# Patient Record
Sex: Male | Born: 1970 | Race: White | Hispanic: No | Marital: Married | Smoking: Never smoker
Health system: Southern US, Community
[De-identification: ages and names within clinical notes are randomized; demographics above are authoritative.]

## PROBLEM LIST (undated history)

## (undated) DIAGNOSIS — T8859XA Other complications of anesthesia, initial encounter: Secondary | ICD-10-CM

## (undated) DIAGNOSIS — R51 Headache: Secondary | ICD-10-CM

## (undated) DIAGNOSIS — M542 Cervicalgia: Secondary | ICD-10-CM

## (undated) DIAGNOSIS — K5900 Constipation, unspecified: Secondary | ICD-10-CM

## (undated) DIAGNOSIS — Z9889 Other specified postprocedural states: Secondary | ICD-10-CM

## (undated) DIAGNOSIS — Z8719 Personal history of other diseases of the digestive system: Secondary | ICD-10-CM

## (undated) DIAGNOSIS — K649 Unspecified hemorrhoids: Secondary | ICD-10-CM

## (undated) DIAGNOSIS — K219 Gastro-esophageal reflux disease without esophagitis: Secondary | ICD-10-CM

## (undated) DIAGNOSIS — T4145XA Adverse effect of unspecified anesthetic, initial encounter: Secondary | ICD-10-CM

## (undated) DIAGNOSIS — R112 Nausea with vomiting, unspecified: Secondary | ICD-10-CM

## (undated) HISTORY — PX: VASECTOMY: SHX75

## (undated) HISTORY — DX: Cervicalgia: M54.2

---

## 2000-09-27 HISTORY — PX: KNEE ARTHROSCOPY W/ ACL RECONSTRUCTION: SHX1858

## 2004-08-25 ENCOUNTER — Ambulatory Visit: Payer: Self-pay | Admitting: Family Medicine

## 2005-11-02 ENCOUNTER — Observation Stay (HOSPITAL_COMMUNITY): Admission: EM | Admit: 2005-11-02 | Discharge: 2005-11-03 | Payer: Self-pay | Admitting: Emergency Medicine

## 2005-11-03 ENCOUNTER — Ambulatory Visit: Payer: Self-pay | Admitting: Emergency Medicine

## 2006-08-28 ENCOUNTER — Ambulatory Visit (HOSPITAL_COMMUNITY): Admission: RE | Admit: 2006-08-28 | Discharge: 2006-08-28 | Payer: Self-pay | Admitting: Family Medicine

## 2012-10-25 ENCOUNTER — Encounter (HOSPITAL_COMMUNITY): Payer: Self-pay | Admitting: *Deleted

## 2012-10-25 ENCOUNTER — Encounter (HOSPITAL_COMMUNITY): Payer: Self-pay | Admitting: Pharmacy Technician

## 2012-10-25 MED ORDER — CEFAZOLIN SODIUM-DEXTROSE 2-3 GM-% IV SOLR
2.0000 g | INTRAVENOUS | Status: AC
  Administered 2012-10-26: 2 g via INTRAVENOUS

## 2012-10-25 MED ORDER — LACTATED RINGERS IV SOLN: INTRAVENOUS | Status: DC

## 2012-10-25 MED ORDER — CHLORHEXIDINE GLUCONATE 4 % EX LIQD: 60.0000 mL | Freq: Once | CUTANEOUS | Status: DC

## 2012-10-26 ENCOUNTER — Encounter (HOSPITAL_COMMUNITY): Payer: Self-pay | Admitting: Anesthesiology

## 2012-10-26 ENCOUNTER — Encounter (HOSPITAL_COMMUNITY)
Admission: RE | Disposition: A | Discharge status: Home / Self Care | Payer: Self-pay | Source: Ambulatory Visit | Attending: Orthopedic Surgery

## 2012-10-26 ENCOUNTER — Ambulatory Visit (HOSPITAL_COMMUNITY): Payer: Managed Care, Other (non HMO) | Admitting: Anesthesiology

## 2012-10-26 ENCOUNTER — Ambulatory Visit (HOSPITAL_COMMUNITY)
Admission: RE | Admit: 2012-10-26 | Discharge: 2012-10-26 | Disposition: A | Discharge status: Home / Self Care | Payer: Managed Care, Other (non HMO) | Source: Ambulatory Visit | Attending: Orthopedic Surgery | Admitting: Orthopedic Surgery

## 2012-10-26 DIAGNOSIS — K219 Gastro-esophageal reflux disease without esophagitis: Secondary | ICD-10-CM | POA: Insufficient documentation

## 2012-10-26 DIAGNOSIS — W010XXA Fall on same level from slipping, tripping and stumbling without subsequent striking against object, initial encounter: Secondary | ICD-10-CM | POA: Insufficient documentation

## 2012-10-26 DIAGNOSIS — S86819A Strain of other muscle(s) and tendon(s) at lower leg level, unspecified leg, initial encounter: Secondary | ICD-10-CM | POA: Insufficient documentation

## 2012-10-26 DIAGNOSIS — S838X9A Sprain of other specified parts of unspecified knee, initial encounter: Secondary | ICD-10-CM | POA: Insufficient documentation

## 2012-10-26 HISTORY — DX: Nausea with vomiting, unspecified: R11.2

## 2012-10-26 HISTORY — DX: Headache: R51

## 2012-10-26 HISTORY — DX: Adverse effect of unspecified anesthetic, initial encounter: T41.45XA

## 2012-10-26 HISTORY — PX: QUADRICEPS TENDON REPAIR: SHX756

## 2012-10-26 HISTORY — DX: Personal history of other diseases of the digestive system: Z87.19

## 2012-10-26 HISTORY — DX: Constipation, unspecified: K59.00

## 2012-10-26 HISTORY — DX: Other complications of anesthesia, initial encounter: T88.59XA

## 2012-10-26 HISTORY — DX: Unspecified hemorrhoids: K64.9

## 2012-10-26 HISTORY — DX: Gastro-esophageal reflux disease without esophagitis: K21.9

## 2012-10-26 HISTORY — DX: Other specified postprocedural states: Z98.890

## 2012-10-26 LAB — BASIC METABOLIC PANEL
Chloride: 102 mEq/L (ref 96–112)
Creatinine, Ser: 0.87 mg/dL (ref 0.50–1.35)
GFR calc Af Amer: >90 mL/min (ref >90)
GFR calc non Af Amer: >90 mL/min (ref >90)
Potassium: 3.6 mEq/L (ref 3.5–5.1)

## 2012-10-26 LAB — CBC
MCHC: 34.8 g/dL (ref 30.0–36.0)
Platelets: 121 10*3/uL — ABNORMAL LOW (ref 150–400)
RDW: 12.8 % (ref 11.5–15.5)
WBC: 5.1 10*3/uL (ref 4.0–10.5)

## 2012-10-26 LAB — SURGICAL PCR SCREEN: MRSA, PCR: NEGATIVE

## 2012-10-26 SURGERY — REPAIR, TENDON, QUADRICEPS
Anesthesia: General | Site: Knee | Laterality: Left | Wound class: Clean

## 2012-10-26 MED ORDER — ONDANSETRON HCL 4 MG/2ML IJ SOLN: 4.0000 mg | Freq: Once | INTRAMUSCULAR | Status: DC | PRN

## 2012-10-26 MED ORDER — HYDROMORPHONE HCL PF 1 MG/ML IJ SOLN: 0.2500 mg | INTRAMUSCULAR | Status: DC | PRN

## 2012-10-26 MED ORDER — MUPIROCIN 2 % EX OINT
TOPICAL_OINTMENT | Freq: Two times a day (BID) | CUTANEOUS | Status: DC
  Filled 2012-10-26: qty 22

## 2012-10-26 MED ORDER — OXYCODONE-ACETAMINOPHEN 5-325 MG PO TABS: 1.0000 | ORAL_TABLET | ORAL | Status: DC | PRN

## 2012-10-26 MED ORDER — SODIUM CHLORIDE 0.9 % IR SOLN
Status: DC | PRN
  Administered 2012-10-26: 1000 mL

## 2012-10-26 MED ORDER — CYCLOBENZAPRINE HCL 10 MG PO TABS
10.0000 mg | ORAL_TABLET | Freq: Three times a day (TID) | ORAL | Status: DC | PRN
Start: 2012-10-26 — End: 2018-10-09

## 2012-10-26 MED ORDER — DEXAMETHASONE SODIUM PHOSPHATE 10 MG/ML IJ SOLN
INTRAMUSCULAR | Status: DC | PRN
  Administered 2012-10-26: 10 mg via INTRAVENOUS

## 2012-10-26 MED ORDER — FENTANYL CITRATE 0.05 MG/ML IJ SOLN
INTRAMUSCULAR | Status: DC | PRN
  Administered 2012-10-26: 50 ug via INTRAVENOUS
  Administered 2012-10-26 (×2): 75 ug via INTRAVENOUS

## 2012-10-26 MED ORDER — LIDOCAINE HCL (CARDIAC) 20 MG/ML IV SOLN
INTRAVENOUS | Status: DC | PRN
  Administered 2012-10-26: 100 mg via INTRAVENOUS

## 2012-10-26 MED ORDER — ROCURONIUM BROMIDE 100 MG/10ML IV SOLN
INTRAVENOUS | Status: DC | PRN
  Administered 2012-10-26: 30 mg via INTRAVENOUS

## 2012-10-26 MED ORDER — FENTANYL CITRATE 0.05 MG/ML IJ SOLN
INTRAMUSCULAR | Status: AC
  Administered 2012-10-26: 100 ug
  Filled 2012-10-26: qty 2

## 2012-10-26 MED ORDER — PROPOFOL 10 MG/ML IV BOLUS
INTRAVENOUS | Status: DC | PRN
  Administered 2012-10-26: 200 mg via INTRAVENOUS

## 2012-10-26 MED ORDER — LACTATED RINGERS IV SOLN
INTRAVENOUS | Status: DC
  Administered 2012-10-26: 10:00:00 via INTRAVENOUS

## 2012-10-26 MED ORDER — ONDANSETRON HCL 4 MG/2ML IJ SOLN
INTRAMUSCULAR | Status: DC | PRN
  Administered 2012-10-26 (×2): 4 mg via INTRAVENOUS

## 2012-10-26 MED ORDER — LACTATED RINGERS IV SOLN
INTRAVENOUS | Status: DC | PRN
  Administered 2012-10-26: 10:00:00 via INTRAVENOUS

## 2012-10-26 MED ORDER — MIDAZOLAM HCL 5 MG/5ML IJ SOLN
INTRAMUSCULAR | Status: DC | PRN
  Administered 2012-10-26: 2 mg via INTRAVENOUS

## 2012-10-26 MED ORDER — BUPIVACAINE HCL (PF) 0.5 % IJ SOLN
INTRAMUSCULAR | Status: DC | PRN
  Administered 2012-10-26: 20 mL

## 2012-10-26 SURGICAL SUPPLY — 55 items
BANDAGE ELASTIC 4 VELCRO ST LF (GAUZE/BANDAGES/DRESSINGS) ×2 IMPLANT
BANDAGE ELASTIC 6 VELCRO ST LF (GAUZE/BANDAGES/DRESSINGS) ×2 IMPLANT
BANDAGE GAUZE ELAST BULKY 4 IN (GAUZE/BANDAGES/DRESSINGS) ×2 IMPLANT
BNDG COHESIVE 4X5 TAN STRL (GAUZE/BANDAGES/DRESSINGS) ×2 IMPLANT
BNDG ESMARK 4X9 LF (GAUZE/BANDAGES/DRESSINGS) ×2 IMPLANT
CLOTH BEACON ORANGE TIMEOUT ST (SAFETY) ×2 IMPLANT
CUFF TOURNIQUET SINGLE 34IN LL (TOURNIQUET CUFF) ×2 IMPLANT
DRAPE ORTHO SPLIT 77X108 STRL (DRAPES) ×2
DRAPE SURG ORHT 6 SPLT 77X108 (DRAPES) ×2 IMPLANT
DRAPE U-SHAPE 47X51 STRL (DRAPES) IMPLANT
DRILL BIT 7/64X5 (BIT) ×2 IMPLANT
DRSG ADAPTIC 3X8 NADH LF (GAUZE/BANDAGES/DRESSINGS) IMPLANT
DRSG PAD ABDOMINAL 8X10 ST (GAUZE/BANDAGES/DRESSINGS) ×4 IMPLANT
GAUZE XEROFORM 1X8 LF (GAUZE/BANDAGES/DRESSINGS) ×2 IMPLANT
GAUZE XEROFORM 5X9 LF (GAUZE/BANDAGES/DRESSINGS) ×2 IMPLANT
GLOVE BIO SURGEON STRL SZ7.5 (GLOVE) ×2 IMPLANT
GLOVE BIO SURGEON STRL SZ8 (GLOVE) ×6 IMPLANT
GLOVE BIO SURGEON STRL SZ8.5 (GLOVE) ×2 IMPLANT
GLOVE EUDERMIC 7 POWDERFREE (GLOVE) ×2 IMPLANT
GLOVE SS BIOGEL STRL SZ 7.5 (GLOVE) ×2 IMPLANT
GLOVE SUPERSENSE BIOGEL SZ 7.5 (GLOVE) ×2
GOWN STRL NON-REIN LRG LVL3 (GOWN DISPOSABLE) ×2 IMPLANT
GOWN STRL REIN XL XLG (GOWN DISPOSABLE) ×4 IMPLANT
HANDPIECE INTERPULSE COAX TIP (DISPOSABLE)
IMMOBILIZER KNEE 22 UNIV (SOFTGOODS) ×2 IMPLANT
IMMOBILIZER KNEE 24 THIGH 36 (MISCELLANEOUS) ×1 IMPLANT
IMMOBILIZER KNEE 24 UNIV (MISCELLANEOUS) ×2
KIT BASIN OR (CUSTOM PROCEDURE TRAY) ×2 IMPLANT
KIT ROOM TURNOVER OR (KITS) ×2 IMPLANT
MANIFOLD NEPTUNE II (INSTRUMENTS) ×2 IMPLANT
NDL SUT 2 .5 CRC MAYO 1.732X (NEEDLE) ×1 IMPLANT
NEEDLE KEITH (NEEDLE) ×2 IMPLANT
NEEDLE MAYO TAPER (NEEDLE) ×1
NS IRRIG 1000ML POUR BTL (IV SOLUTION) ×2 IMPLANT
PACK GENERAL/GYN (CUSTOM PROCEDURE TRAY) ×2 IMPLANT
PAD ARMBOARD 7.5X6 YLW CONV (MISCELLANEOUS) ×4 IMPLANT
PAD CAST 4YDX4 CTTN HI CHSV (CAST SUPPLIES) ×2 IMPLANT
PADDING CAST COTTON 4X4 STRL (CAST SUPPLIES) ×2
PADDING CAST COTTON 6X4 STRL (CAST SUPPLIES) ×2 IMPLANT
PASSER SUT SWANSON 36MM LOOP (INSTRUMENTS) IMPLANT
SET HNDPC FAN SPRY TIP SCT (DISPOSABLE) IMPLANT
SPONGE GAUZE 4X4 12PLY (GAUZE/BANDAGES/DRESSINGS) ×2 IMPLANT
SPONGE LAP 4X18 X RAY DECT (DISPOSABLE) IMPLANT
STOCKINETTE IMPERVIOUS 9X36 MD (GAUZE/BANDAGES/DRESSINGS) ×2 IMPLANT
SUT ETHIBOND 2 V 37 (SUTURE) IMPLANT
SUT FIBERWIRE #2 38 T-5 BLUE (SUTURE) ×4
SUT MNCRL AB 3-0 PS2 18 (SUTURE) ×2 IMPLANT
SUT VIC AB 0 CT1 27 (SUTURE) ×1
SUT VIC AB 0 CT1 27XBRD ANBCTR (SUTURE) ×1 IMPLANT
SUT VIC AB 1 CT1 27 (SUTURE) ×1
SUT VIC AB 1 CT1 27XBRD ANBCTR (SUTURE) ×1 IMPLANT
SUT VIC AB 2-0 CT1 27 (SUTURE) ×1
SUT VIC AB 2-0 CT1 TAPERPNT 27 (SUTURE) ×1 IMPLANT
SUTURE FIBERWR #2 38 T-5 BLUE (SUTURE) ×2 IMPLANT
WATER STERILE IRR 1000ML POUR (IV SOLUTION) ×2 IMPLANT

## 2012-10-26 NOTE — H&P (Signed)
Shane Graham    Chief Complaint: Left Quad Tendon Rupture HPI: The patient is a 42 y.o. male with left quad tendon rupture  Past Medical History  Diagnosis Date  . Complication of anesthesia   . PONV (postoperative nausea and vomiting)   . Headache     not recently  . Hemorrhoid   . GERD (gastroesophageal reflux disease)     takes tums  . Constipation   . H/O hiatal hernia     Past Surgical History  Procedure Date  . Knee arthroscopy w/ acl reconstruction 2002    left    History reviewed. No pertinent family history.  Social History:  reports that he has never smoked. He does not have any smokeless tobacco history on file. He reports that he drinks about 2.3 ounces of alcohol per week. He reports that he does not use illicit drugs.  Allergies: No Known Allergies  Medications Prior to Admission  Medication Sig Dispense Refill  . acidophilus (RISAQUAD) CAPS Take 1 capsule by mouth daily.      . cholecalciferol (VITAMIN D) 1000 UNITS tablet Take 1,000 Units by mouth daily.      . fish oil-omega-3 fatty acids 1000 MG capsule Take 1 g by mouth daily.      Marland Kitchen glucosamine-chondroitin 500-400 MG tablet Take 1 tablet by mouth 3 (three) times daily.      . Multiple Vitamin (MULTIVITAMIN WITH MINERALS) TABS Take 1 tablet by mouth daily.      Marland Kitchen oxyCODONE-acetaminophen (PERCOCET/ROXICET) 5-325 MG per tablet Take 1 tablet by mouth every 4 (four) hours as needed. For pain      . vitamin E 100 UNIT capsule Take 100 Units by mouth daily.         Physical Exam: left knee with palpable defect in Quad tendon and petalla baja  Vitals  Temp:  [99.3 F (37.4 C)] 99.3 F (37.4 C) (01/30 0836) Pulse Rate:  [86] 86  (01/30 0836) Resp:  [18] 18  (01/30 0836) BP: (114)/(70) 114/70 mmHg (01/30 0836) SpO2:  [100 %] 100 % (01/30 0836) Weight:  [83.915 kg (185 lb)] 83.915 kg (185 lb) (01/29 1900)  Assessment/Plan  Impression: Left Quad Tendon Rupture  Plan of Action: Procedure(s): REPAIR  QUADRICEP TENDON  Laurel Smeltz M 10/26/2012, 9:39 AM

## 2012-10-26 NOTE — Progress Notes (Signed)
Attempted to call report to [facility].  Received busy signal.

## 2012-10-26 NOTE — Anesthesia Preprocedure Evaluation (Signed)
Anesthesia Evaluation  Patient identified by MRN, date of birth, ID band Patient awake    Reviewed: Allergy & Precautions, H&P , NPO status , Patient's Chart, lab work & pertinent test results  History of Anesthesia Complications (+) PONV  Airway Mallampati: I TM Distance: >3 FB Neck ROM: full    Dental   Pulmonary          Cardiovascular Rhythm:regular Rate:Normal     Neuro/Psych  Headaches,    GI/Hepatic hiatal hernia, GERD-  ,  Endo/Other    Renal/GU      Musculoskeletal   Abdominal   Peds  Hematology   Anesthesia Other Findings   Reproductive/Obstetrics                           Anesthesia Physical Anesthesia Plan  ASA: I  Anesthesia Plan: General   Post-op Pain Management: MAC Combined w/ Regional for Post-op pain   Induction: Intravenous  Airway Management Planned: LMA and Oral ETT  Additional Equipment:   Intra-op Plan:   Post-operative Plan: Extubation in OR  Informed Consent: I have reviewed the patients History and Physical, chart, labs and discussed the procedure including the risks, benefits and alternatives for the proposed anesthesia with the patient or authorized representative who has indicated his/her understanding and acceptance.     Plan Discussed with: CRNA, Anesthesiologist and Surgeon  Anesthesia Plan Comments:         Anesthesia Quick Evaluation

## 2012-10-26 NOTE — Op Note (Signed)
NAME:  Dehaven, Dominico                 ACCOUNT NO.:  [id number]  MEDICAL RECORD NO.:  [id number]  LOCATION:  [facility]                         FACILITY:  [facility]  PHYSICIAN:  Vania Rea. Tywanna Seifer, M.D.  DATE OF BIRTH:  1971/03/11  DATE OF PROCEDURE:  10/26/2012 DATE OF DISCHARGE:  10/26/2012                              OPERATIVE REPORT   PREOPERATIVE DIAGNOSIS:  Left quadriceps tendon rupture.  POSTOPERATIVE DIAGNOSIS:  Left quadriceps tendon rupture.  PROCEDURE:  Left quadriceps tendon repair.  SURGEON:  Vania Rea. Roscoe Witts, MD  ASSISTANT:  Lucita Lora. Shuford, PA-C  ANESTHESIA:  General endotracheal as well as a femoral nerve block.  TOURNIQUET TIME:  Less than 1 hour.  BLOOD LOSS:  Minimal.  DRAINS:  None.  HISTORY:  Ms. Presley is a 42 year old gentleman who this past weekend had slipped on the night, sustaining a flexion injury to the left knee with immediate complaints of pain and inability to extend the knee. Plain radiographs showed a chronic traction osteophyte at the superior pole of the patella with patella baja and clinically palpable defect of quad tendon, superior pole of the patella.  He was brought to the operating room at this time for planned quad tendon repair.  Preoperatively, I counseled Mr. Hazen on treatment options as well as risks versus benefits thereof.  Possible surgical complications were reviewed including the potential for bleeding, infection, neurovascular injury, persistent pain, loss of motion, anesthetic complication, recurrent quad tendon rupture, loss of motion, anesthetic complication, possible need for additional surgery.  He understands and accepts and agrees with our planned procedure.  PROCEDURE IN DETAIL:  After undergoing routine preop evaluation, the patient received prophylactic antibiotics and a femoral nerve block was established in the holding area by the Anesthesia Department.  Placed supine on the operating table, underwent smooth induction  of general endotracheal anesthesia.  Tourniquet was applied to the left thigh. Left leg was sterilely prepped and draped in standard fashion.  Time-out was called.  Leg was exsanguinated with the tourniquet inflated to 350 mmHg.  An anterior midline incision was then made approximately 10 cm in length, centered over the patella and distal quadriceps.  Skin flaps were elevated.  Dissection carried deeply and medially bringing the ruptured quadriceps view.  Deep layers were divided and retracted medially and laterally allowing exposure of the medial and lateral retinaculum which were torn as well.  The proximal end of the patella was then debrided of residual soft tissue and a rongeur was then used to resect the remaining soft tissue layer such that an exposed bony bed at the apex of the patella was achieved.  We used a 15 blade to freshen the distal margin of the quadriceps back to healthy tissue.  Two parallel Krackow grasping sutures were then placed through the quadriceps tendon using #2 FiberWire.  A series of 3 longitudinal drill holes were then passed through the patella exiting distally.  The suture limbs were then passed through the bone tunnels with the [facility] needle.  This construct was then tensioned and provisional tightening of the sutures was then performed.  The knee was then flexed up to 60 degrees, removing all laxity out of  the construct.  Plate knee was then placed back in the full extension.  Sutures were then tightened and tied, allowing excellent reapposition of the quad tendon to the superior aspect of the patella.  At this point, then I performed repair of the medial and lateral retinaculum with figure-of-eight #1 Vicryl sutures.  A 2-0 Vicryl was then used to close the deep fascial layer and peritenon and 0 Vicryl used for the deep fascial layer, deep subcu, 2-0 Vicryl for the superficial subcu, and intracuticular 3-0 Monocryl for the skin followed by Steri-Strips.   Dry dressings were applied.  Tourniquet was let down. The leg was placed into a knee immobilizer, maintained in full extension after dressings had been applied.  The tourniquet was let down.  Knee immobilizer placed.  The patient was then awakened, extubated, and taken to the recovery room in stable condition.  Ralene Bathe, PA-C was used as an Geophysicist/field seismologist throughout this case essential for help with positioning of the extremity, retraction, wound closure, tissue manipulation, and intraoperative decision making.     Vania Rea. Vada Swift, M.D.     KMS/MEDQ  D:  10/26/2012  T:  10/26/2012  Job:  [id number]

## 2012-10-26 NOTE — Anesthesia Procedure Notes (Signed)
Anesthesia Regional Block:  Femoral nerve block  Pre-Anesthetic Checklist: ,, timeout performed, Correct Patient, Correct Site, Correct Laterality, Correct Procedure, Correct Position, site marked, Risks and benefits discussed,  Surgical consent,  Pre-op evaluation,  At surgeon's request and post-op pain management  Laterality: Left  Prep: Maximum Sterile Barrier Precautions used, chloraprep and alcohol swabs       Needles:  Injection technique: Single-shot  Needle Type: Stimulator Needle - 80        Needle insertion depth: 6 cm   Additional Needles:  Procedures: nerve stimulator Femoral nerve block  Nerve Stimulator or Paresthesia:  Response: 0.5 mA, 0.1 ms, 6 cm  Additional Responses:   Narrative:  Start time: 10/26/2012 10:15 AM End time: 10/26/2012 10:20 AM Injection made incrementally with aspirations every 5 mL.  Performed by: Personally  Anesthesiologist: Maren Beach MD  Additional Notes: Pt accepts procedure and risks. 20cc 0.5% Marcaine w/o epi and 10cc 1.0% Licaine w/ epi w/o difficulty or discomfort. GES

## 2012-10-26 NOTE — Transfer of Care (Signed)
Immediate Anesthesia Transfer of Care Note  Patient: Shane Graham  Procedure(s) Performed: Procedure(s) (LRB) with comments: REPAIR QUADRICEP TENDON (Left)  Patient Location: PACU  Anesthesia Type:General  Level of Consciousness: awake, alert  and oriented  Airway & Oxygen Therapy: Patient Spontanous Breathing and Patient connected to nasal cannula oxygen  Post-op Assessment: Report given to PACU RN, Post -op Vital signs reviewed and stable and Patient moving all extremities  Post vital signs: Reviewed and stable  Complications: No apparent anesthesia complications

## 2012-10-26 NOTE — Preoperative (Signed)
Beta Blockers   Reason not to administer Beta Blockers:Not Applicable 

## 2012-10-26 NOTE — Anesthesia Postprocedure Evaluation (Signed)
  Anesthesia Post-op Note  Patient: Shane Graham  Procedure(s) Performed: Procedure(s) (LRB) with comments: REPAIR QUADRICEP TENDON (Left)  Patient Location: PACU  Anesthesia Type:GA combined with regional for post-op pain  Level of Consciousness: awake, oriented, sedated and patient cooperative  Airway and Oxygen Therapy: Patient Spontanous Breathing  Post-op Pain: mild  Post-op Assessment: Post-op Vital signs reviewed, Patient's Cardiovascular Status Stable, Respiratory Function Stable, Patent Airway, No signs of Nausea or vomiting and Pain level controlled  Post-op Vital Signs: stable  Complications: No apparent anesthesia complications

## 2012-10-26 NOTE — Op Note (Signed)
10/26/2012  11:33 AM  PATIENT:   Shane Graham  42 y.o. male  PRE-OPERATIVE DIAGNOSIS:  Left Quad Tendon Rupture  POST-OPERATIVE DIAGNOSIS:  same  PROCEDURE:  Left quad tendon repair  SURGEON:  Ledell Codrington, Vania Rea M.D.  ASSISTANTS: Shuford pac   ANESTHESIA:   GET + FNB  EBL: min  SPECIMEN:  none  Drains: none  TT < 1hr   PATIENT DISPOSITION:  PACU - hemodynamically stable.    PLAN OF CARE: Discharge to home after PACU  Dictation# [id number]

## 2012-10-27 ENCOUNTER — Encounter (HOSPITAL_COMMUNITY): Payer: Self-pay | Admitting: Orthopedic Surgery

## 2012-10-27 MED FILL — Mupirocin Oint 2%: CUTANEOUS | Qty: 22 | Status: AC

## 2018-03-03 DIAGNOSIS — M542 Cervicalgia: Secondary | ICD-10-CM | POA: Insufficient documentation

## 2018-04-03 DIAGNOSIS — R200 Anesthesia of skin: Secondary | ICD-10-CM | POA: Insufficient documentation

## 2018-10-09 ENCOUNTER — Encounter: Payer: Self-pay | Admitting: *Deleted

## 2018-10-11 ENCOUNTER — Encounter: Payer: Self-pay | Admitting: Neurology

## 2018-10-11 ENCOUNTER — Ambulatory Visit (INDEPENDENT_AMBULATORY_CARE_PROVIDER_SITE_OTHER): Payer: Managed Care, Other (non HMO) | Admitting: Neurology

## 2018-10-11 ENCOUNTER — Encounter

## 2018-10-11 VITALS — BP 109/73 | HR 73 | Ht 70.0 in | Wt 200.0 lb

## 2018-10-11 DIAGNOSIS — R29898 Other symptoms and signs involving the musculoskeletal system: Secondary | ICD-10-CM | POA: Insufficient documentation
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
## 2018-11-06 ENCOUNTER — Ambulatory Visit (INDEPENDENT_AMBULATORY_CARE_PROVIDER_SITE_OTHER): Payer: Managed Care, Other (non HMO) | Admitting: Neurology

## 2018-11-06 DIAGNOSIS — R202 Paresthesia of skin: Secondary | ICD-10-CM | POA: Diagnosis not present

## 2018-11-06 DIAGNOSIS — R29898 Other symptoms and signs involving the musculoskeletal system: Secondary | ICD-10-CM

## 2018-11-06 NOTE — Procedures (Signed)
Full Name: Shane Graham Gender: Male MRN #: [id number] Date of Birth: 1971-01-02    Visit Date: 11/06/2018 09:03 Age: 48 Years 10 Months Old Examining Physician: Levert Feinstein, MD  Referring Physician: Levert Feinstein, MD History: 48 years old male, with history of left neck pain, radiating pain to left shoulder and left arm  Summary of the tests: Nerve conduction study: Bilateral median, ulnar sensory and motor responses were normal.  Electromyography: Selected needle examination of left upper extremity and left cervical paraspinal muscles were normal.   Conclusion: This is a normal study.  There is no electrodiagnostic evidence of left upper extremity neuropathy or left cervical radiculopathy.    ------------------------------- Levert Feinstein, M.D. PhD  [facility] [address] [place], [place] [address] Tel: [phone] Fax: [phone]        [facility]    Nerve / Sites Muscle Latency Ref. Amplitude Ref. Rel Amp Segments Distance Velocity Ref. Area    ms ms mV mV %  cm m/s m/s mVms  R Median - APB     Wrist APB 3.7 ?4.4 9.6 ?4.0 100 Wrist - APB 7   40.9     Upper arm APB 7.6  8.9  93.5 Upper arm - Wrist 22 57 ?49 40.3  L Median - APB     Wrist APB 3.6 ?4.4 13.7 ?4.0 100 Wrist - APB 7   62.6     Upper arm APB 7.6  13.4  97.7 Upper arm - Wrist 23 58 ?49 59.2  R Ulnar - ADM     Wrist ADM 3.1 ?3.3 14.0 ?6.0 100 Wrist - ADM 7   44.4     B.Elbow ADM 6.3  13.2  93.8 B.Elbow - Wrist 18 58 ?49 44.7     A.Elbow ADM 8.1  13.6  103 A.Elbow - B.Elbow 10 53 ?49 47.7         A.Elbow - Wrist      L Ulnar - ADM     Wrist ADM 2.8 ?3.3 15.0 ?6.0 100 Wrist - ADM 7   49.8     B.Elbow ADM 6.1  14.8  98.8 B.Elbow - Wrist 19 57 ?49 49.3     A.Elbow ADM 7.9  14.0  94.2 A.Elbow - B.Elbow 10 56 ?49 48.8         A.Elbow - Wrist                 SNC    Nerve / Sites Rec. Site Peak Lat Ref.  Amp Ref. Segments Distance    ms ms V V  cm  R Median - Orthodromic (Dig II, Mid palm)       Dig II Wrist 3.3 ?3.4 17 ?10 Dig II - Wrist 13  L Median - Orthodromic (Dig II, Mid palm)     Dig II Wrist 3.3 ?3.4 16 ?10 Dig II - Wrist 13  R Ulnar - Orthodromic, (Dig V, Mid palm)     Dig V Wrist 3.0 ?3.1 10 ?5 Dig V - Wrist 11  L Ulnar - Orthodromic, (Dig V, Mid palm)     Dig V Wrist 2.8 ?3.1 8 ?5 Dig V - Wrist 70              F  Wave    Nerve F Lat Ref.   ms ms  R Ulnar - ADM 26.9 ?32.0  L Ulnar - ADM 27.3 ?32.0  EMG full       EMG Summary Table    Spontaneous MUAP Recruitment  Muscle IA Fib PSW Fasc Other Amp Dur. Poly Pattern  L. First dorsal interosseous Normal None None None _______ Normal Normal Normal Normal  L. Pronator teres Normal None None None _______ Normal Normal Normal Normal  L. Brachioradialis Normal None None None _______ Normal Normal Normal Normal  L. Extensor digitorum communis Normal None None None _______ Normal Normal Normal Normal  L. Biceps brachii Normal None None None _______ Normal Normal Normal Normal  L. Deltoid Normal None None None _______ Normal Normal Normal Normal  L. Triceps brachii Normal None None None _______ Normal Normal Normal Normal  L. Cervical paraspinals Normal None None None _______ Normal Normal Normal Normal

## 2018-11-06 NOTE — Addendum Note (Signed)
Addended by: Levert FeinsteinYAN, Elya Diloreto on: 11/06/2018 10:25 AM   Modules accepted: Level of Service
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
## 2019-08-14 ENCOUNTER — Telehealth: Payer: Self-pay | Admitting: Hematology

## 2019-08-14 NOTE — Telephone Encounter (Signed)
Received a new hem referral from Marilynne Drivers to discuss recent heterozygous prothrombin mutation. Mr. Chriscoe has been cld and scheduled to see Dr. Irene Limbo on 12/10 at 10am. Pt aware to arrive 15 minutes early.

## 2019-09-05 NOTE — Progress Notes (Signed)
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 male who has been referred to us by Tally JoeSwayne, David, MD for evaluation and management of Heterozygous prothrombin mutation . The pt reports that he is doing well overall.   The pt reports he was tested for blood disorders due to his twin brother being diagnosed with blood clots/PE after a hospital stay. Pt thinks the clots were unprovoked and this was his brothers first time having blood clots. His brother was a previous smoker but he quit smoking in his late 3630s. The brother saw a hematologist on 06/09/2019. The hematologist diagnosed him with a prothrombin gene mutation and antiphospholipid syndrome. Brother advised pt that this can be genetic, and for pt to be tested.   PT has 3 kids, two boys and a girl   The pt has never had a blood clot.   Pt does not have a history of heart issues.  He is taking famotidine 20 mg, EpiPen 1mg , and Zyrtec 10mg      Most recent lab results (12/13/2018) of CBC is as follows: all values are WNL except for: .  On review of systems, pt reports he is doing well and denies hx of blood clots, abdominal pain, leg swelling and any other symptoms.   On PMHx the pt reports bee sting allergy,GERD, depression, genital herpes . Surgeries:ACL/MCL left knee 2002, vasectomy, umbilical hernia repair, quad repair 2014, colon-05/18/2019 On Social Hx the pt reports **  On Family Hx the pt reports that his mother has had blood clots previously, in her early 2170s. She never had a miscarriage.  No other family members with blood clots. No history of lupus or autoimmune  conditions. Father: alive, cholesterol, diagnosed with hypertension Mother alive, thyroid cancer Paternal Grandfather: deceased, brain tumor Paternal Grandmother: deceased, heart attack at 3967 Maternal Grandfather: deceased, lung cancer; brain tumor, lung cancer Maternal Grandmother: decreased Brother 1: alive, DVT, antiphospholipid syndrome Brother 2: alive Brother 3: alive  3 brother(s), 2 son (s), 1 daughter- healthy Neg hx for colon cancer, colon polyps or liver disease   MEDICAL HISTORY:  Past Medical History:  Diagnosis Date  . Complication of anesthesia   . Constipation   . GERD (gastroesophageal reflux disease)    takes tums  . H/O hiatal hernia   . Headache(784.0)    not recently  . Hemorrhoid   . Neck pain   . PONV (postoperative nausea and vomiting)      SURGICAL HISTORY: Past Surgical History:  Procedure Laterality Date  . KNEE ARTHROSCOPY W/ ACL RECONSTRUCTION  2002   left  . QUADRICEPS TENDON REPAIR  10/26/2012   Procedure: REPAIR QUADRICEP TENDON;  Surgeon: Senaida LangeKevin M Supple, MD;  Location: [facility] OR;  Service: Orthopedics;  Laterality: Left;  Marland Kitchen. VASECTOMY       SOCIAL HISTORY: Social History   Socioeconomic History  . Marital status: Married    Spouse name: Not on file  . Number of children: Not on file  . Years of education: Not on file  . Highest education level: Not on file  Occupational History  . Occupation: Buyer, retailAccountant/Auditor  Social Needs  . Financial resource strain: Not on file  .  Food insecurity    Worry: Not on file    Inability: Not on file  . Transportation needs    Medical: Not on file    Non-medical: Not on file  Tobacco Use  . Smoking status: Never Smoker  . Smokeless tobacco: Never Used  Substance and Sexual Activity  . Alcohol use: Yes    Comment: moderate use  . Drug use: No  . Sexual activity: Not on file  Lifestyle  . Physical activity    Days per week: Not on file    Minutes per session: Not on file  . Stress: Not on  file  Relationships  . Social Herbalist on phone: Not on file    Gets together: Not on file    Attends religious service: Not on file    Active member of club or organization: Not on file    Attends meetings of clubs or organizations: Not on file    Relationship status: Not on file  . Intimate partner violence    Fear of current or ex partner: Not on file    Emotionally abused: Not on file    Physically abused: Not on file    Forced sexual activity: Not on file  Other Topics Concern  . Not on file  Social History Narrative   Left-handed.     FAMILY HISTORY: Family History  Problem Relation Age of Onset  . Heart disease Father      ALLERGIES:   is allergic to bee venom and other.   MEDICATIONS:  Current Outpatient Medications  Medication Sig Dispense Refill  . Calcium Carbonate Antacid (TUMS PO) Take by mouth.    . EPINEPHrine 0.3 MG/0.3ML SOSY as needed.    . loratadine (CLARITIN) 10 MG tablet Take 10 mg by mouth daily.    . meclizine (ANTIVERT) 25 MG tablet Take 25 mg by mouth as needed for dizziness.    Marland Kitchen scopolamine (TRANSDERM-SCOP) 1 MG/3DAYS Place 1 patch onto the skin every 3 (three) days.     No current facility-administered medications for this visit.      REVIEW OF SYSTEMS:   A 10+ POINT REVIEW OF SYSTEMS WAS OBTAINED including neurology, dermatology, psychiatry, cardiac, respiratory, lymph, extremities, GI, GU, Musculoskeletal, constitutional, breasts, reproductive, HEENT.  All pertinent positives are noted in the HPI.  All others are negative.    PHYSICAL EXAMINATION: ECOG PERFORMANCE STATUS: 0 - Asymptomatic  Vitals:   09/06/19 1000  BP: 140/85  Pulse: 79  Resp: 18  Temp: 97.9 F (36.6 C)  SpO2: 100%   Filed Weights   09/06/19 1000  Weight: 194 lb (88 kg)   Body mass index is 27.84 kg/m.  GENERAL:alert, in no acute distress and comfortable SKIN: no acute rashes, no significant lesions EYES: conjunctiva are pink and  non-injected, sclera anicteric OROPHARYNX: MMM, no exudates, no oropharyngeal erythema or ulceration NECK: supple, no JVD LYMPH:  no palpable lymphadenopathy in the cervical, axillary or inguinal regions LUNGS: clear to auscultation b/l with normal respiratory effort HEART: regular rate & rhythm ABDOMEN:  normoactive bowel sounds , non tender, not distended. Extremity: no pedal edema PSYCH: alert & oriented x 3 with fluent speech NEURO: no focal motor/sensory deficits   LABORATORY DATA:  I have reviewed the data as listed  CBC Latest Ref Rng & Units 10/26/2012  WBC 4.0 - 10.5 K/uL 5.1  Hemoglobin 13.0 - 17.0 g/dL 12.8(L)  Hematocrit 39.0 - 52.0 % 36.8(L)  Platelets 150 - 400 K/uL  121(L)    CMP Latest Ref Rng & Units 10/26/2012  Glucose 70 - 99 mg/dL 97  BUN 6 - 23 mg/dL 15  Creatinine 4.92 - 0.10 mg/dL 0.71  Sodium 219 - 758 mEq/L 139  Potassium 3.5 - 5.1 mEq/L 3.6  Chloride 96 - 112 mEq/L 102  CO2 19 - 32 mEq/L 24  Calcium 8.4 - 10.5 mg/dL 9.0    83/25/4982:  Factor II, DNA Analysis Single G-20210-A mutation Identified (heterozygote) "These factors include the R506Q (leiden) mutation in the Factor V gene, plasma homocysteine levels, as well as testing for deficiencies of antithrombin III, Protein C and protein s."  RADIOGRAPHIC STUDIES: I have personally reviewed the radiological images as listed and agreed with the findings in the report. No results found.   ASSESSMENT & PLAN:  OMARE BILOTTA is a 48 y.o. male with:   1. Heterozygous prothrombin gene mutation.  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 -Discussed that platelets were normal -Discussed that preventative blood thinners are  not need at this time -Discussed travel precautions and other preventive methods for blood clots. -Discussed that pt has the option to start a baby aspirin but it can interfere with GERD. Suggested a coated baby aspirin -Suggested to follow up with with PCP for regularly cancer screening. -recommended his children and other biologic related siblings and relatives on mother's sideof family be tested for presence of mutation. -information handouts provided.  FOLLOW UP: RTC with Dr Candise Che as needed Continue f/u with PCP  All of the patients questions were answered with apparent satisfaction. The patient knows to call the clinic with any problems, questions or concerns.  I spent 40 minutes counseling the patient face to face. The total time spent in the appointment was 60  minutes and more than 50% was on counseling and direct patient cares.    Wyvonnia Lora MD MS AAHIVMS [facility] Hematology/Oncology Physician [facility]  (Office):       [phone] (Work cell):  [phone] (Fax):           [phone]  09/05/2019 3:46 PM  I, Charlotte Crumb, am acting as a scribe for Dr. Wyvonnia Lora.   .I have reviewed the above documentation for accuracy and completeness, and I agree with the above. Johney Maine MD

## 2019-09-06 ENCOUNTER — Inpatient Hospital Stay: Payer: Managed Care, Other (non HMO) | Attending: Hematology | Admitting: Hematology

## 2019-09-06 ENCOUNTER — Other Ambulatory Visit: Payer: Self-pay

## 2019-09-06 ENCOUNTER — Telehealth: Payer: Self-pay | Admitting: Hematology

## 2019-09-06 VITALS — BP 140/85 | HR 79 | Temp 97.9°F | Resp 18 | Ht 70.0 in | Wt 194.0 lb

## 2019-09-06 DIAGNOSIS — Z79899 Other long term (current) drug therapy: Secondary | ICD-10-CM | POA: Insufficient documentation

## 2019-09-06 DIAGNOSIS — B009 Herpesviral infection, unspecified: Secondary | ICD-10-CM | POA: Diagnosis not present

## 2019-09-06 DIAGNOSIS — Z801 Family history of malignant neoplasm of trachea, bronchus and lung: Secondary | ICD-10-CM | POA: Diagnosis not present

## 2019-09-06 DIAGNOSIS — K219 Gastro-esophageal reflux disease without esophagitis: Secondary | ICD-10-CM | POA: Diagnosis not present

## 2019-09-06 DIAGNOSIS — Z8249 Family history of ischemic heart disease and other diseases of the circulatory system: Secondary | ICD-10-CM | POA: Diagnosis not present

## 2019-09-06 DIAGNOSIS — Z8349 Family history of other endocrine, nutritional and metabolic diseases: Secondary | ICD-10-CM

## 2019-09-06 DIAGNOSIS — D6852 Prothrombin gene mutation: Secondary | ICD-10-CM | POA: Insufficient documentation

## 2019-09-06 DIAGNOSIS — Z820 Family history of epilepsy and other diseases of the nervous system: Secondary | ICD-10-CM | POA: Diagnosis not present

## 2019-09-06 NOTE — Patient Instructions (Signed)
Thank you for choosing [facility] to provide your oncology and hematology care.   Should you have questions after your visit to the [facility] ([facility]), please contact this office at [phone] between 8:30 AM and 4:30 PM.  Voice mails left after 4:00 PM may not be returned until the following business day.  Calls received after 4:30 PM will be answered by an off-site Nurse Triage Line.    Prescription Refills:  Please have your pharmacy contact us directly for most prescription requests.  Contact the office directly for refills of narcotics (pain medications). Allow 48-72 hours for refills.  Appointments: Please contact the [facility] scheduling department [phone] for questions regarding [facility] appointment scheduling.  Contact the schedulers with any scheduling changes so that your appointment can be rescheduled in a timely manner.   Central Scheduling for [facility] [phone] - Call to schedule procedures such as PET scans, CT scans, MRI, Ultrasound, etc.  To afford each patient quality time with our providers, please arrive 30 minutes before your scheduled appointment time.  If you arrive late for your appointment, you may be asked to reschedule.  We strive to give you quality time with our providers, and arriving late affects you and other patients whose appointments are after yours. If you are a no show for multiple scheduled visits, you may be dismissed from the clinic at the providers discretion.     Resources: [facility] Social Workers [phone] for additional information on assistance programs --Anne Cunningham/Abigail Elmore  [place] DSS  [phone]: Information regarding food stamps, Medicaid, and utility assistance [place] [phone]   [place] [employer]'s shared-ride transportation service for eligible riders who have a disability that prevents them from riding the fixed route bus.   Medicare Rights Center 800-333-4114 Helps people with  Medicare understand their rights and benefits, navigate the Medicare system, and secure the quality healthcare they deserve [employer] 800-227-2345 Assists patients locate various types of support and financial assistance Cancer Care: 1-800-813-HOPE (4673) Provides financial assistance, online support groups, medication/co-pay assistance.   Transportation Assistance for appointments at [facility]: Transportation Coordinator [phone]  Again, thank you for choosing [facility] for your care.       

## 2019-09-06 NOTE — Telephone Encounter (Signed)
Per 12/10 los RTC with Dr Irene Limbo as needed Continue f/u with PCP

## 2019-09-13 ENCOUNTER — Other Ambulatory Visit (HOSPITAL_COMMUNITY): Payer: Self-pay | Admitting: Physician Assistant

## 2019-09-13 DIAGNOSIS — M79604 Pain in right leg: Secondary | ICD-10-CM

## 2019-09-13 DIAGNOSIS — M7989 Other specified soft tissue disorders: Secondary | ICD-10-CM

## 2019-09-24 ENCOUNTER — Ambulatory Visit (HOSPITAL_COMMUNITY): Payer: Managed Care, Other (non HMO)

## 2019-09-25 ENCOUNTER — Emergency Department (HOSPITAL_COMMUNITY): Payer: Managed Care, Other (non HMO)

## 2019-09-25 ENCOUNTER — Other Ambulatory Visit: Payer: Self-pay

## 2019-09-25 ENCOUNTER — Encounter (HOSPITAL_COMMUNITY): Payer: Self-pay | Admitting: Emergency Medicine

## 2019-09-25 ENCOUNTER — Encounter (HOSPITAL_COMMUNITY): Payer: Self-pay

## 2019-09-25 ENCOUNTER — Inpatient Hospital Stay (HOSPITAL_COMMUNITY)
Admission: EM | Admit: 2019-09-25 | Discharge: 2019-09-27 | DRG: 176 | Disposition: A | Discharge status: Home / Self Care | Payer: Managed Care, Other (non HMO) | Source: Ambulatory Visit | Attending: Internal Medicine | Admitting: Internal Medicine

## 2019-09-25 ENCOUNTER — Ambulatory Visit (INDEPENDENT_AMBULATORY_CARE_PROVIDER_SITE_OTHER)
Admission: EM | Admit: 2019-09-25 | Discharge: 2019-09-25 | Disposition: A | Discharge status: Home / Self Care | Payer: Managed Care, Other (non HMO) | Source: Home / Self Care

## 2019-09-25 DIAGNOSIS — R0602 Shortness of breath: Secondary | ICD-10-CM | POA: Diagnosis not present

## 2019-09-25 DIAGNOSIS — K219 Gastro-esophageal reflux disease without esophagitis: Secondary | ICD-10-CM | POA: Diagnosis present

## 2019-09-25 DIAGNOSIS — I82411 Acute embolism and thrombosis of right femoral vein: Secondary | ICD-10-CM | POA: Diagnosis present

## 2019-09-25 DIAGNOSIS — I2699 Other pulmonary embolism without acute cor pulmonale: Principal | ICD-10-CM | POA: Diagnosis present

## 2019-09-25 DIAGNOSIS — Z7982 Long term (current) use of aspirin: Secondary | ICD-10-CM

## 2019-09-25 DIAGNOSIS — Z832 Family history of diseases of the blood and blood-forming organs and certain disorders involving the immune mechanism: Secondary | ICD-10-CM

## 2019-09-25 DIAGNOSIS — I82552 Chronic embolism and thrombosis of left peroneal vein: Secondary | ICD-10-CM | POA: Diagnosis present

## 2019-09-25 DIAGNOSIS — D6852 Prothrombin gene mutation: Secondary | ICD-10-CM

## 2019-09-25 DIAGNOSIS — I82532 Chronic embolism and thrombosis of left popliteal vein: Secondary | ICD-10-CM | POA: Diagnosis present

## 2019-09-25 DIAGNOSIS — Z20828 Contact with and (suspected) exposure to other viral communicable diseases: Secondary | ICD-10-CM | POA: Diagnosis present

## 2019-09-25 DIAGNOSIS — I82512 Chronic embolism and thrombosis of left femoral vein: Secondary | ICD-10-CM | POA: Diagnosis present

## 2019-09-25 DIAGNOSIS — Z79899 Other long term (current) drug therapy: Secondary | ICD-10-CM

## 2019-09-25 DIAGNOSIS — Z8249 Family history of ischemic heart disease and other diseases of the circulatory system: Secondary | ICD-10-CM

## 2019-09-25 DIAGNOSIS — I2694 Multiple subsegmental pulmonary emboli without acute cor pulmonale: Secondary | ICD-10-CM

## 2019-09-25 DIAGNOSIS — I82542 Chronic embolism and thrombosis of left tibial vein: Secondary | ICD-10-CM | POA: Diagnosis present

## 2019-09-25 DIAGNOSIS — I82451 Acute embolism and thrombosis of right peroneal vein: Secondary | ICD-10-CM | POA: Diagnosis present

## 2019-09-25 DIAGNOSIS — I82441 Acute embolism and thrombosis of right tibial vein: Secondary | ICD-10-CM | POA: Diagnosis present

## 2019-09-25 LAB — COMPREHENSIVE METABOLIC PANEL
ALT: 32 U/L (ref 0–44)
AST: 29 U/L (ref 15–41)
Albumin: 4.3 g/dL (ref 3.5–5.0)
Alkaline Phosphatase: 85 U/L (ref 38–126)
Anion gap: 11 (ref 5–15)
BUN: 17 mg/dL (ref 6–20)
CO2: 24 mmol/L (ref 22–32)
Calcium: 9.4 mg/dL (ref 8.9–10.3)
Chloride: 106 mmol/L (ref 98–111)
Creatinine, Ser: 1.34 mg/dL — ABNORMAL HIGH (ref 0.61–1.24)
GFR calc Af Amer: >60 mL/min (ref >60)
GFR calc non Af Amer: >60 mL/min (ref >60)
Glucose, Bld: 103 mg/dL — ABNORMAL HIGH (ref 70–99)
Potassium: 3.7 mmol/L (ref 3.5–5.1)
Sodium: 141 mmol/L (ref 135–145)
Total Bilirubin: 0.2 mg/dL — ABNORMAL LOW (ref 0.3–1.2)
Total Protein: 7.7 g/dL (ref 6.5–8.1)

## 2019-09-25 LAB — CBC
HCT: 43.9 % (ref 39.0–52.0)
Hemoglobin: 14.3 g/dL (ref 13.0–17.0)
MCH: 31 pg (ref 26.0–34.0)
MCHC: 32.6 g/dL (ref 30.0–36.0)
MCV: 95.2 fL (ref 80.0–100.0)
Platelets: 253 10*3/uL (ref 150–400)
RBC: 4.61 MIL/uL (ref 4.22–5.81)
RDW: 12.7 % (ref 11.5–15.5)
WBC: 7.1 10*3/uL (ref 4.0–10.5)
nRBC: 0 % (ref 0.0–0.2)

## 2019-09-25 LAB — TROPONIN I (HIGH SENSITIVITY): Troponin I (High Sensitivity): 13 ng/L (ref <18)

## 2019-09-25 LAB — D-DIMER, QUANTITATIVE (NOT AT ARMC): D-Dimer, Quant: 8.33 ug/mL-FEU — ABNORMAL HIGH (ref 0.00–0.50)

## 2019-09-25 MED ORDER — IOHEXOL 350 MG/ML SOLN
100.0000 mL | Freq: Once | INTRAVENOUS | Status: AC | PRN
  Administered 2019-09-25: 100 mL via INTRAVENOUS

## 2019-09-25 MED ORDER — SODIUM CHLORIDE (PF) 0.9 % IJ SOLN
INTRAMUSCULAR | Status: AC
  Filled 2019-09-25: qty 50

## 2019-09-25 NOTE — ED Provider Notes (Signed)
[facility] Emergency Department Provider Note MRN:  [id number]  Arrival date & time: 09/26/19     Chief Complaint   Abnormal Lab   History of Present Illness   Shane Graham is a 48 y.o. year-old male with a history of GERD presenting to the ED with chief complaint of abnormal lab.  Patient is endorsing shortness of breath, worse with exertion, for the past 2 weeks.  Also endorsing intermittent burning and/or chest pressure, center of the chest, mild in severity.  Sent here after PCP office found a elevated D-dimer.  Patient reports family history of blood clots.  Review of Systems  A complete 10 system review of systems was obtained and all systems are negative except as noted in the HPI and PMH.   Patient's Health History    Past Medical History:  Diagnosis Date  . Complication of anesthesia   . Constipation   . GERD (gastroesophageal reflux disease)    takes tums  . H/O hiatal hernia   . Headache(784.0)    not recently  . Hemorrhoid   . Neck pain   . PONV (postoperative nausea and vomiting)     Past Surgical History:  Procedure Laterality Date  . KNEE ARTHROSCOPY W/ ACL RECONSTRUCTION  2002   left  . QUADRICEPS TENDON REPAIR  10/26/2012   Procedure: REPAIR QUADRICEP TENDON;  Surgeon: Marin Shutter, MD;  Location: [place];  Service: Orthopedics;  Laterality: Left;  Marland Kitchen VASECTOMY      Family History  Problem Relation Age of Onset  . Heart disease Father   . Hypertension Father   . Deep vein thrombosis Brother     Social History   Socioeconomic History  . Marital status: Married    Spouse name: Not on file  . Number of children: Not on file  . Years of education: Not on file  . Highest education level: Not on file  Occupational History  . Occupation: Accountant/Auditor  Tobacco Use  . Smoking status: Never Smoker  . Smokeless tobacco: Never Used  Substance and Sexual Activity  . Alcohol use: Yes    Comment: moderate use  . Drug use:  No  . Sexual activity: Not on file  Other Topics Concern  . Not on file  Social History Narrative   Left-handed.   Social Determinants of Health   Financial Resource Strain:   . Difficulty of Paying Living Expenses: Not on file  Food Insecurity:   . Worried About Charity fundraiser in the Last Year: Not on file  . Ran Out of Food in the Last Year: Not on file  Transportation Needs:   . Lack of Transportation (Medical): Not on file  . Lack of Transportation (Non-Medical): Not on file  Physical Activity:   . Days of Exercise per Week: Not on file  . Minutes of Exercise per Session: Not on file  Stress:   . Feeling of Stress : Not on file  Social Connections:   . Frequency of Communication with Friends and Family: Not on file  . Frequency of Social Gatherings with Friends and Family: Not on file  . Attends Religious Services: Not on file  . Active Member of Clubs or Organizations: Not on file  . Attends Archivist Meetings: Not on file  . Marital Status: Not on file  Intimate Partner Violence:   . Fear of Current or Ex-Partner: Not on file  . Emotionally Abused: Not on file  . Physically  Abused: Not on file  . Sexually Abused: Not on file     Physical Exam  Vital Signs and Nursing Notes reviewed Vitals:   09/25/19 2235 09/25/19 2359  BP: 121/83 126/84  Pulse: 73 77  Resp: 15 14  Temp:    SpO2: 98% 100%    CONSTITUTIONAL: Well-appearing, NAD NEURO:  Alert and oriented x 3, no focal deficits EYES:  eyes equal and reactive ENT/NECK:  no LAD, no JVD CARDIO: Regular rate, well-perfused, normal S1 and S2 PULM:  CTAB no wheezing or rhonchi GI/GU:  normal bowel sounds, non-distended, non-tender MSK/SPINE:  No gross deformities, no edema SKIN:  no rash, atraumatic PSYCH:  Appropriate speech and behavior  Diagnostic and Interventional Summary    EKG Interpretation  Date/Time:  Tuesday September 25 2019 22:04:14 EST Ventricular Rate:  79 PR Interval:    QRS  Duration: 97 QT Interval:  410 QTC Calculation: 470 R Axis:   55 Text Interpretation: Sinus rhythm T wave inversion V3 Confirmed by Kennis Carina [phone]) on 09/25/2019 10:12:54 PM      Labs Reviewed  COMPREHENSIVE METABOLIC PANEL - Abnormal; Notable for the following components:      Result Value   Glucose, Bld 103 (*)    Creatinine, Ser 1.34 (*)    Total Bilirubin 0.2 (*)    All other components within normal limits  SARS CORONAVIRUS 2 (TAT 6-24 HRS)  CBC  PROTIME-INR  APTT  CBC  HEPARIN LEVEL (UNFRACTIONATED)  TROPONIN I (HIGH SENSITIVITY)  TROPONIN I (HIGH SENSITIVITY)    CTA Chest for PE  Final Result      Medications  sodium chloride (PF) 0.9 % injection (has no administration in time range)  heparin bolus via infusion 3,000 Units (has no administration in time range)  heparin ADULT infusion 100 units/mL (25000 units/239mL sodium chloride 0.45%) (has no administration in time range)  iohexol (OMNIPAQUE) 350 MG/ML injection 100 mL (100 mLs Intravenous Contrast Given 09/25/19 2323)     Procedures  /  Critical Care .Critical Care Performed by: Sabas Sous, MD Authorized by: Sabas Sous, MD   Critical care provider statement:    Critical care time (minutes):  35   Critical care was necessary to treat or prevent imminent or life-threatening deterioration of the following conditions: Acute pulmonary embolism with heart strain.   Critical care was time spent personally by me on the following activities:  Discussions with consultants, evaluation of patient's response to treatment, examination of patient, ordering and performing treatments and interventions, ordering and review of laboratory studies, ordering and review of radiographic studies, pulse oximetry, re-evaluation of patient's condition, obtaining history from patient or surrogate and review of old charts    ED Course and Medical Decision Making  I have reviewed the triage vital signs and the nursing  notes.  Pertinent labs & imaging results that were available during my care of the patient were reviewed by me and considered in my medical decision making (see below for details).     Well-appearing, no increased work of breathing, clear lungs, no evidence of DVT, reassuring vital signs, however markedly elevated D-dimer, strong family history of VTE.  Will need CTA chest.  CT reveals bilateral pulmonary emboli, moderate clot burden, mild heart strain.  Given the heart strain, case was discussed with intensivist on-call.  Patient is hemodynamically stable, generally feels well, appropriate for hospitalist admission.  Elmer Sow. Pilar Plate, MD [facility] Emergency Medicine [facility] [email or web address]  Final Clinical Impressions(s) /  ED Diagnoses     ICD-10-CM   1. Multiple subsegmental pulmonary emboli without acute cor pulmonale (HCC)  I26.94     ED Discharge Orders    None       Discharge Instructions Discussed with and Provided to Patient:   Discharge Instructions   None       Sabas SousBero, Eryn Marandola M, MD 09/26/19 [phone]

## 2019-09-25 NOTE — ED Provider Notes (Signed)
[facility]   MRN: [id number] DOB: 1971-04-09  Subjective:   Shane Graham is a 48 y.o. male presenting for 2 week hx persistent shortness of breath especially while exercising but also happens while walking up the stairs.  Patient's primary concern is that he has a prothrombin gene mutation with a family history of blood clots.  The patient himself is never had a blood clot.  However, he did have lower leg swelling about 3 weeks ago, saw his PCP for this.  His swelling ended up resolving and therefore the ultrasound was not pursued ultimately.  Today he would like to make sure that he gets checked for a blood clot and also COVID-19.  No current facility-administered medications for this encounter.  Current Outpatient Medications:  .  aspirin EC 81 MG tablet, Take 81 mg by mouth daily., Disp: , Rfl:  .  EPINEPHrine 0.3 MG/0.3ML SOSY, as needed., Disp: , Rfl:  .  FAMOTIDINE PO, Take by mouth., Disp: , Rfl:  .  loratadine (CLARITIN) 10 MG tablet, Take 10 mg by mouth daily., Disp: , Rfl:  .  meclizine (ANTIVERT) 25 MG tablet, Take 25 mg by mouth as needed for dizziness., Disp: , Rfl:  .  scopolamine (TRANSDERM-SCOP) 1 MG/3DAYS, Place 1 patch onto the skin every 3 (three) days., Disp: , Rfl:    Allergies  Allergen Reactions  . Bee Venom Hives  . Other     Hayfever and Allergy Relief OTC causes eye redness.    Past Medical History:  Diagnosis Date  . Complication of anesthesia   . Constipation   . GERD (gastroesophageal reflux disease)    takes tums  . H/O hiatal hernia   . Headache(784.0)    not recently  . Hemorrhoid   . Neck pain   . PONV (postoperative nausea and vomiting)      Past Surgical History:  Procedure Laterality Date  . KNEE ARTHROSCOPY W/ ACL RECONSTRUCTION  2002   left  . QUADRICEPS TENDON REPAIR  10/26/2012   Procedure: REPAIR QUADRICEP TENDON;  Surgeon: Senaida Lange, MD;  Location: [facility];  Service: Orthopedics;  Laterality: Left;  Marland Kitchen VASECTOMY       Family History  Problem Relation Age of Onset  . Heart disease Father   . Hypertension Father   . Deep vein thrombosis Brother     Social History   Tobacco Use  . Smoking status: Never Smoker  . Smokeless tobacco: Never Used  Substance Use Topics  . Alcohol use: Yes    Comment: moderate use  . Drug use: No    Review of Systems  Constitutional: Negative for fever and malaise/fatigue.  HENT: Negative for congestion, ear pain, sinus pain and sore throat.   Eyes: Negative for discharge and redness.  Respiratory: Positive for shortness of breath. Negative for cough, hemoptysis and wheezing.   Cardiovascular: Negative for chest pain.  Gastrointestinal: Negative for abdominal pain, diarrhea, nausea and vomiting.  Genitourinary: Negative for dysuria, flank pain and hematuria.  Musculoskeletal: Negative for myalgias.  Skin: Negative for rash.  Neurological: Negative for dizziness, weakness and headaches.  Psychiatric/Behavioral: Negative for depression and substance abuse.    Objective:   Vitals: BP (!) 151/92 (BP Location: Left Arm)   Pulse 71   Temp 97.7 F (36.5 C) (Oral)   Resp 16   SpO2 98%   Physical Exam Constitutional:      General: He is not in acute distress.    Appearance: Normal appearance.  He is well-developed. He is not ill-appearing, toxic-appearing or diaphoretic.  HENT:     Head: Normocephalic and atraumatic.     Right Ear: External ear normal.     Left Ear: External ear normal.     Nose: Nose normal.     Mouth/Throat:     Mouth: Mucous membranes are moist.     Pharynx: Oropharynx is clear.  Eyes:     General: No scleral icterus.    Extraocular Movements: Extraocular movements intact.     Pupils: Pupils are equal, round, and reactive to light.  Cardiovascular:     Rate and Rhythm: Normal rate and regular rhythm.     Heart sounds: Normal heart sounds. No murmur. No friction rub. No gallop.   Pulmonary:     Effort: Pulmonary effort is normal.  No respiratory distress.     Breath sounds: Normal breath sounds. No stridor. No wheezing, rhonchi or rales.  Neurological:     Mental Status: He is alert and oriented to person, place, and time.  Psychiatric:        Mood and Affect: Mood normal.        Behavior: Behavior normal.        Thought Content: Thought content normal.        Judgment: Judgment normal.     Assessment and Plan :   1. Shortness of breath   2. Prothrombin gene mutation [facility])     Physical exam findings and vital signs reassuring.  Counseled patient that a CT is definitive at ruling out a pulmonary embolism but would have to be obtained to the ER at this point.  Counseled that he is not a good candidate for this at the moment but he was agreeable to pursuing a D-dimer test as a preliminary test.  COVID-19 testing is pending.  Recommended modification of his physical activities including exercising indoors and not outdoors anymore.  Will use albuterol inhaler as an attempt to address his shortness of breath.  Recommended supportive care otherwise. Counseled patient on potential for adverse effects with medications prescribed/recommended today, ER and return-to-clinic precautions discussed, patient verbalized understanding.    Jaynee Eagles, [place] 09/25/19 1933

## 2019-09-25 NOTE — ED Triage Notes (Signed)
Pt c/o SOB with mod exercise x 2 weeks. 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 Mild scratchy throat. Denies fever, congestion, HA or n/v/d. Denies hemoptysis. Able to speak full sentences w/o difficulty or SOB. C/o RLE swelling 3 weeks ago which resolved spontaneously.

## 2019-09-25 NOTE — ED Notes (Signed)
Bero MD at bedside.  

## 2019-09-25 NOTE — ED Notes (Signed)
Patient transported to CT 

## 2019-09-25 NOTE — ED Triage Notes (Signed)
Patient sent from [facility] for evaluation of positive d-dimer. Hx prothrombin gene mutation.

## 2019-09-26 ENCOUNTER — Observation Stay (HOSPITAL_COMMUNITY): Payer: Managed Care, Other (non HMO)

## 2019-09-26 ENCOUNTER — Encounter (HOSPITAL_COMMUNITY): Payer: Self-pay | Admitting: Internal Medicine

## 2019-09-26 DIAGNOSIS — D6852 Prothrombin gene mutation: Secondary | ICD-10-CM

## 2019-09-26 DIAGNOSIS — Z20828 Contact with and (suspected) exposure to other viral communicable diseases: Secondary | ICD-10-CM | POA: Diagnosis present

## 2019-09-26 DIAGNOSIS — I82451 Acute embolism and thrombosis of right peroneal vein: Secondary | ICD-10-CM | POA: Diagnosis present

## 2019-09-26 DIAGNOSIS — K219 Gastro-esophageal reflux disease without esophagitis: Secondary | ICD-10-CM

## 2019-09-26 DIAGNOSIS — I82441 Acute embolism and thrombosis of right tibial vein: Secondary | ICD-10-CM | POA: Diagnosis present

## 2019-09-26 DIAGNOSIS — I82512 Chronic embolism and thrombosis of left femoral vein: Secondary | ICD-10-CM | POA: Diagnosis present

## 2019-09-26 DIAGNOSIS — I82542 Chronic embolism and thrombosis of left tibial vein: Secondary | ICD-10-CM | POA: Diagnosis present

## 2019-09-26 DIAGNOSIS — I82411 Acute embolism and thrombosis of right femoral vein: Secondary | ICD-10-CM | POA: Diagnosis present

## 2019-09-26 DIAGNOSIS — I82532 Chronic embolism and thrombosis of left popliteal vein: Secondary | ICD-10-CM | POA: Diagnosis present

## 2019-09-26 DIAGNOSIS — I2699 Other pulmonary embolism without acute cor pulmonale: Secondary | ICD-10-CM

## 2019-09-26 DIAGNOSIS — Z8249 Family history of ischemic heart disease and other diseases of the circulatory system: Secondary | ICD-10-CM | POA: Diagnosis not present

## 2019-09-26 DIAGNOSIS — I82552 Chronic embolism and thrombosis of left peroneal vein: Secondary | ICD-10-CM | POA: Diagnosis present

## 2019-09-26 DIAGNOSIS — I2602 Saddle embolus of pulmonary artery with acute cor pulmonale: Secondary | ICD-10-CM | POA: Diagnosis not present

## 2019-09-26 DIAGNOSIS — Z832 Family history of diseases of the blood and blood-forming organs and certain disorders involving the immune mechanism: Secondary | ICD-10-CM | POA: Diagnosis not present

## 2019-09-26 DIAGNOSIS — Z7982 Long term (current) use of aspirin: Secondary | ICD-10-CM | POA: Diagnosis not present

## 2019-09-26 DIAGNOSIS — I82403 Acute embolism and thrombosis of unspecified deep veins of lower extremity, bilateral: Secondary | ICD-10-CM | POA: Diagnosis not present

## 2019-09-26 DIAGNOSIS — Z79899 Other long term (current) drug therapy: Secondary | ICD-10-CM | POA: Diagnosis not present

## 2019-09-26 LAB — CBC
HCT: 40.7 % (ref 39.0–52.0)
HCT: 43.3 % (ref 39.0–52.0)
Hemoglobin: 13.4 g/dL (ref 13.0–17.0)
Hemoglobin: 14.4 g/dL (ref 13.0–17.0)
MCH: 31.2 pg (ref 26.0–34.0)
MCH: 31.6 pg (ref 26.0–34.0)
MCHC: 32.9 g/dL (ref 30.0–36.0)
MCHC: 33.3 g/dL (ref 30.0–36.0)
MCV: 94.7 fL (ref 80.0–100.0)
MCV: 95 fL (ref 80.0–100.0)
Platelets: 234 10*3/uL (ref 150–400)
Platelets: 263 10*3/uL (ref 150–400)
RBC: 4.3 MIL/uL (ref 4.22–5.81)
RBC: 4.56 MIL/uL (ref 4.22–5.81)
RDW: 12.8 % (ref 11.5–15.5)
RDW: 12.6 % (ref 11.5–15.5)
WBC: 6.1 10*3/uL (ref 4.0–10.5)
WBC: 6.5 10*3/uL (ref 4.0–10.5)
nRBC: 0 % (ref 0.0–0.2)
nRBC: 0 % (ref 0.0–0.2)

## 2019-09-26 LAB — ECHOCARDIOGRAM COMPLETE
Height: 70 in
Weight: 3040 oz

## 2019-09-26 LAB — HEPARIN LEVEL (UNFRACTIONATED)
Heparin Unfractionated: 0.65 IU/mL (ref 0.30–0.70)
Heparin Unfractionated: <0.1 IU/mL — ABNORMAL LOW (ref 0.30–0.70)
Heparin Unfractionated: 0.46 IU/mL (ref 0.30–0.70)

## 2019-09-26 LAB — PROTIME-INR
INR: 1 (ref 0.8–1.2)
Prothrombin Time: 13.4 seconds (ref 11.4–15.2)

## 2019-09-26 LAB — BASIC METABOLIC PANEL
Anion gap: 7 (ref 5–15)
BUN: 22 mg/dL — ABNORMAL HIGH (ref 6–20)
CO2: 24 mmol/L (ref 22–32)
Calcium: 8.9 mg/dL (ref 8.9–10.3)
Chloride: 108 mmol/L (ref 98–111)
Creatinine, Ser: 0.98 mg/dL (ref 0.61–1.24)
GFR calc Af Amer: >60 mL/min (ref >60)
GFR calc non Af Amer: >60 mL/min (ref >60)
Glucose, Bld: 101 mg/dL — ABNORMAL HIGH (ref 70–99)
Potassium: 5 mmol/L (ref 3.5–5.1)
Sodium: 139 mmol/L (ref 135–145)

## 2019-09-26 LAB — APTT: aPTT: 28 seconds (ref 24–36)

## 2019-09-26 LAB — HIV ANTIBODY (ROUTINE TESTING W REFLEX): HIV Screen 4th Generation wRfx: NONREACTIVE

## 2019-09-26 LAB — TROPONIN I (HIGH SENSITIVITY): Troponin I (High Sensitivity): 13 ng/L (ref <18)

## 2019-09-26 LAB — SARS CORONAVIRUS 2 (TAT 6-24 HRS): SARS Coronavirus 2: NEGATIVE

## 2019-09-26 MED ORDER — ACETAMINOPHEN 325 MG PO TABS: 650.0000 mg | ORAL_TABLET | Freq: Four times a day (QID) | ORAL | Status: DC | PRN

## 2019-09-26 MED ORDER — HEPARIN BOLUS VIA INFUSION
3000.0000 [IU] | Freq: Once | INTRAVENOUS | Status: AC
  Administered 2019-09-26: 3000 [IU] via INTRAVENOUS
  Filled 2019-09-26: qty 3000

## 2019-09-26 MED ORDER — ACETAMINOPHEN 650 MG RE SUPP: 650.0000 mg | Freq: Four times a day (QID) | RECTAL | Status: DC | PRN

## 2019-09-26 MED ORDER — CHLORHEXIDINE GLUCONATE CLOTH 2 % EX PADS
6.0000 | MEDICATED_PAD | Freq: Every day | CUTANEOUS | Status: DC
  Administered 2019-09-26: 6 via TOPICAL

## 2019-09-26 MED ORDER — ONDANSETRON HCL 4 MG/2ML IJ SOLN: 4.0000 mg | Freq: Four times a day (QID) | INTRAMUSCULAR | Status: DC | PRN

## 2019-09-26 MED ORDER — FAMOTIDINE 20 MG PO TABS
20.0000 mg | ORAL_TABLET | Freq: Two times a day (BID) | ORAL | Status: DC
  Administered 2019-09-26 – 2019-09-27 (×4): 20 mg via ORAL
  Filled 2019-09-26 (×2): qty 1

## 2019-09-26 MED ORDER — HEPARIN (PORCINE) 25000 UT/250ML-% IV SOLN
1500.0000 [IU]/h | INTRAVENOUS | Status: DC
  Administered 2019-09-26: 1500 [IU]/h via INTRAVENOUS
  Filled 2019-09-26 (×2): qty 250

## 2019-09-26 MED ORDER — SODIUM CHLORIDE 0.9% FLUSH
3.0000 mL | Freq: Two times a day (BID) | INTRAVENOUS | Status: DC
  Administered 2019-09-26 (×2): 3 mL via INTRAVENOUS

## 2019-09-26 MED ORDER — ONDANSETRON HCL 4 MG PO TABS: 4.0000 mg | ORAL_TABLET | Freq: Four times a day (QID) | ORAL | Status: DC | PRN

## 2019-09-26 NOTE — Progress Notes (Signed)
Lower extremity venous has been completed.   Preliminary results in CV Proc.   Abram Sander 09/26/2019 12:07 PM

## 2019-09-26 NOTE — Progress Notes (Signed)
eLink Physician-Brief Progress Note Patient Name: Shane Graham DOB: 1970/10/31 MRN: [id number]   Date of Service  09/26/2019  HPI/Events of Note  PT with PE  And mild indicators of sub-massive physiology, patient oxygenation and hemodynamics are completely stable at this time. Ratio was 1.0.  eICU Interventions  Hospitalist to admit Pt to ICU step down and obtain an echo in a.m. to further assess RV function.        Kerry Kass Jisselle Poth 09/26/2019, 12:33 AM

## 2019-09-26 NOTE — Progress Notes (Signed)
ANTICOAGULATION CONSULT NOTE - Initial Consult  Pharmacy Consult for Heparin Indication: pulmonary embolus  Allergies  Allergen Reactions  . Bee Venom Hives  . Other     Hayfever and Allergy Relief OTC causes eye redness.    Patient Measurements: Height: 5\' 10"  (177.8 cm) Weight: 190 lb (86.2 kg) IBW/kg (Calculated) : 73 Heparin Dosing Weight:   Vital Signs: Temp: 97.8 F (36.6 C) (12/29 2125) Temp Source: Oral (12/29 2125) BP: 126/84 (12/29 2359) Pulse Rate: 77 (12/29 2359)  Labs: Recent Labs    09/25/19 2143  HGB 14.3  HCT 43.9  PLT 253  CREATININE 1.34*  TROPONINIHS 13    Estimated Creatinine Clearance: 68.1 mL/min (A) (by C-G formula based on SCr of 1.34 mg/dL (H)).   Medical History: Past Medical History:  Diagnosis Date  . Complication of anesthesia   . Constipation   . GERD (gastroesophageal reflux disease)    takes tums  . H/O hiatal hernia   . Headache(784.0)    not recently  . Hemorrhoid   . Neck pain   . PONV (postoperative nausea and vomiting)     Medications:  Infusions:  . heparin      Assessment: Patient with PE.  No oral anticoagulants noted on med rec. Baseline coags ordered.  Goal of Therapy:  Heparin level 0.3-0.7 units/ml Monitor platelets by anticoagulation protocol: Yes   Plan:  Heparin bolus 3000  units iv x1 Heparin drip at 1500  units/hr Daily CBC Next heparin level at 0800    Tyler Deis, Shea Stakes [place] 09/26/2019,12:26 AM

## 2019-09-26 NOTE — Progress Notes (Signed)
Pharmacy - IV heparin  Assessment:    Please see note from Elenor Quinones, PharmD earlier today for full details.  Briefly, 48 y.o. male on IV heparin for new PE   Previous heparin level therapeutic at 0.65 units/mL on 1500 units/hr  Most recent heparin level undetectable; charted as off for several hours just before level drawn and confirmed with ED RN  Per ICU RN, IV repositioned and heparin gtt resumed at 5pm today  No bleeding or PE symptoms present per RN  Plan:   Continue heparin at 1500 units/hr  Retime heparin level for 6 hrs after heparin restarted  Shane Graham, PharmD, BCPS [phone] 09/26/2019, 5:53 PM

## 2019-09-26 NOTE — ED Notes (Signed)
Patient is resting comfortably. 

## 2019-09-26 NOTE — H&P (Signed)
History and Physical    Shane Curryric J Alaimo ZOX:[id number] DOB: 06-23-1971 DOA: 09/25/2019  PCP: Wilfrid LundBecker, Anna G, PA  Patient coming from: Home  I have personally briefly reviewed patient's old medical records in [facility] Link  Chief Complaint: Shortness of breath  HPI: Shane Graham is a 48 y.o. male with medical history significant for heterozygous prothrombin gene mutation and GERD who presents to the ED for evaluation of shortness of breath.  Patient reports a strong family history of blood clots in his mother and his brother.  His brother was diagnosed for a blood clot this past summer and treated.  He had a work-up which revealed a prothrombin gene mutation therefore patient was also tested due to possible genetic component.  Per hematology, Dr. Clyda GreenerKale's note 09/06/2019 patient tested positive for heterozygous prothrombin gene mutation (Single Prothrombin [id number] mutation Identified).  Due to increased risk for VTE patient started a low-dose aspirin 81 mg daily.  Patient says about 3 weeks ago he was moving some objects when he noticed having swelling and pain in his right lower extremity.  He saw his PCP who is in a set him up for a lower extremity ultrasound however the swelling and pain resolved on its own therefore this was not completed.  Over the last 2 weeks patient has been having shortness of breath with moderate exertion as well as occasional palpitations.  He has not been having any chest pain.  He reports having fever at the time of his right leg swelling which has since also resolved.  He has been having a dry cough.  He denies any hemoptysis or other obvious bleeding.  His only other medication is famotidine for GERD.  He is a never smoker.  He drinks 3-4 alcoholic beverages per week.  He denies any illicit drug use.  He has not had any recent surgeries.  Patient went to urgent care initially for further evaluation.  A D-dimer was obtained and was elevated to 8.33 and he was  subsequently advised to come to the ED for further evaluation and management.  ED Course:  Initial vitals showed BP 140/94, pulse 83, RR 19, temp 97.8 Fahrenheit, SPO2 98% on room air.  Labs notable for WBC 7.1, hemoglobin 14.3, platelets 253,000, sodium 141, potassium 3.7, bicarb 24, BUN 17, creatinine 1.34, high-sensitivity troponin I 13x2.  SARS-CoV-2 PCR is obtained and pending.  CTA chest PE study was positive for acute pulmonary emboli in the distal main pulmonary artery extending into lobar and segmental branches and left lower lobar pulmonary artery extending into segmental and subsegmental branches.  Per radiology read, RV/LV ratio is 1.0 suggestive of mild right heart strain.  Patient was started on IV heparin.  EDP discussed with on-call PCCM who recommended admission to stepdown unit under hospitalist service as patient is currently hemodynamically stable.  The hospitalist service was consulted admit for further evaluation management.  Review of Systems: All systems reviewed and are negative except as documented in history of present illness above.   Past Medical History:  Diagnosis Date  . Complication of anesthesia   . Constipation   . GERD (gastroesophageal reflux disease)    takes tums  . H/O hiatal hernia   . Headache(784.0)    not recently  . Hemorrhoid   . Neck pain   . PONV (postoperative nausea and vomiting)     Past Surgical History:  Procedure Laterality Date  . KNEE ARTHROSCOPY W/ ACL RECONSTRUCTION  2002   left  .  QUADRICEPS TENDON REPAIR  10/26/2012   Procedure: REPAIR QUADRICEP TENDON;  Surgeon: Senaida Lange, MD;  Location: [facility] OR;  Service: Orthopedics;  Laterality: Left;  Marland Kitchen VASECTOMY      Social History:  reports that he has never smoked. He has never used smokeless tobacco. He reports current alcohol use. He reports that he does not use drugs.  Allergies  Allergen Reactions  . Bee Venom Hives  . Other     Hayfever and Allergy Relief OTC  causes eye redness.    Family History  Problem Relation Age of Onset  . Heart disease Father   . Hypertension Father   . Deep vein thrombosis Mother   . Deep vein thrombosis Brother      Prior to Admission medications   Medication Sig Start Date End Date Taking? Authorizing Provider  aspirin EC 81 MG tablet Take 81 mg by mouth daily.   Yes [provider]  EPINEPHrine 0.3 MG/0.3ML SOSY Inject 1 each into the skin as needed (allergic reaction).    Yes [provider]  famotidine (PEPCID) 20 MG tablet Take 20 mg by mouth 2 (two) times daily.    Yes [provider]  loratadine (CLARITIN) 10 MG tablet Take 10 mg by mouth daily.   Yes [provider]  scopolamine (TRANSDERM-SCOP) 1 MG/3DAYS Place 1 patch onto the skin daily as needed (sea sickness).    Yes [provider]    Physical Exam: Vitals:   09/25/19 2125 09/25/19 2235 09/25/19 2359 09/26/19 0020  BP: (!) 140/94 121/83 126/84   Pulse: 82 73 77   Resp: 19 15 14    Temp: 97.8 F (36.6 C)     TempSrc: Oral     SpO2: 98% 98% 100%   Weight:    86.2 kg  Height:    5\' 10"  (1.778 m)    Constitutional: Resting in bed with head elevated, NAD, calm, comfortable Eyes: PERRL, lids and conjunctivae normal ENMT: Mucous membranes are moist. Posterior pharynx clear of any exudate or lesions.Normal dentition.  Neck: normal, supple, no masses. Respiratory: clear to auscultation bilaterally, no wheezing, no crackles. Normal respiratory effort. No accessory muscle use.  Cardiovascular: Regular rate and rhythm, no murmurs / rubs / gallops. No extremity edema. 2+ pedal pulses. Abdomen: no tenderness, no masses palpated. No hepatosplenomegaly. Bowel sounds positive.  Musculoskeletal: no clubbing / cyanosis. No joint deformity upper and lower extremities. Good ROM, no contractures. Normal muscle tone.  No swelling or tenderness bilateral lower extremities. Skin: no rashes, lesions, ulcers. No  induration Neurologic: CN 2-12 grossly intact. Sensation intact, Strength 5/5 in all 4.  Psychiatric: Normal judgment and insight. Alert and oriented x 3. Normal mood.     Labs on Admission: I have personally reviewed following labs and imaging studies  CBC: Recent Labs  Lab 09/25/19 2143  WBC 7.1  HGB 14.3  HCT 43.9  MCV 95.2  PLT 253   Basic Metabolic Panel: Recent Labs  Lab 09/25/19 2143  NA 141  K 3.7  CL 106  CO2 24  GLUCOSE 103*  BUN 17  CREATININE 1.34*  CALCIUM 9.4   GFR: Estimated Creatinine Clearance: 68.1 mL/min (A) (by C-G formula based on SCr of 1.34 mg/dL (H)). Liver Function Tests: Recent Labs  Lab 09/25/19 2143  AST 29  ALT 32  ALKPHOS 85  BILITOT 0.2*  PROT 7.7  ALBUMIN 4.3   No results for input(s): LIPASE, AMYLASE in the last 168 hours. No results for  input(s): AMMONIA in the last 168 hours. Coagulation Profile: Recent Labs  Lab 09/26/19 0029  INR 1.0   Cardiac Enzymes: No results for input(s): CKTOTAL, CKMB, CKMBINDEX, TROPONINI in the last 168 hours. BNP (last 3 results) No results for input(s): PROBNP in the last 8760 hours. HbA1C: No results for input(s): HGBA1C in the last 72 hours. CBG: No results for input(s): GLUCAP in the last 168 hours. Lipid Profile: No results for input(s): CHOL, HDL, LDLCALC, TRIG, CHOLHDL, LDLDIRECT in the last 72 hours. Thyroid Function Tests: No results for input(s): TSH, T4TOTAL, FREET4, T3FREE, THYROIDAB in the last 72 hours. Anemia Panel: No results for input(s): VITAMINB12, FOLATE, FERRITIN, TIBC, IRON, RETICCTPCT in the last 72 hours. Urine analysis: No results found for: COLORURINE, APPEARANCEUR, LABSPEC, PHURINE, GLUCOSEU, HGBUR, BILIRUBINUR, KETONESUR, PROTEINUR, UROBILINOGEN, NITRITE, LEUKOCYTESUR  Radiological Exams on Admission: CTA Chest for PE  Result Date: 09/26/2019 CLINICAL DATA:  Shortness of breath. D-dimer. Patient reports shortness of breath with exercise for 2 weeks.  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 EXAM: CT ANGIOGRAPHY CHEST WITH CONTRAST TECHNIQUE: Multidetector CT imaging of the chest was performed using the standard protocol during bolus administration of intravenous contrast. Multiplanar CT image reconstructions and MIPs were obtained to evaluate the vascular anatomy. CONTRAST:  OMNIPAQUE IOHEXOL 350 MG/ML SOLN COMPARISON:  Chest CTA 11/02/2005. No interval exams. FINDINGS: Cardiovascular: Positive for acute pulmonary emboli. Filling defect in the distal main pulmonary artery extending into lobar and segmental branches. Filling defects in the left lower lobar pulmonary artery extending into segmental and subsegmental branches. Thromboembolic burden is moderate. RV to LV ratio is 1.0. No pericardial effusion. No aortic dissection. Conventional branching pattern from the aortic arch. Mediastinum/Nodes: No enlarged mediastinal or hilar lymph nodes. Tiny hiatal hernia, esophagus is decompressed. No visualized thyroid nodule. Lungs/Pleura: No evidence of pulmonary infarct or focal airspace disease. No pleural fluid. No pulmonary edema. Trachea and mainstem bronchi are patent. Motion artifact at the lung bases partially obscures evaluation. Upper Abdomen: No acute findings. Musculoskeletal: There are no acute or suspicious osseous abnormalities. Review of the MIP images confirms the above findings. IMPRESSION: 1. Positive for acute PE with CT evidence of mild right heart strain (RV/LV Ratio = 1.0) consistent with at least submassive (intermediate risk) PE. The presence of right heart strain has been associated with an increased risk of morbidity and mortality. Please activate Code PE by paging [phone]. 2. No pulmonary infarct or other acute intrathoracic abnormality. Critical Value/emergent results were called by telephone at the time of interpretation on 09/26/2019 at 12:12 am to Dr Kennis Carina , who verbally  acknowledged these results. Electronically Signed   By: Narda Rutherford M.D.   On: 09/26/2019 00:12    EKG: Independently reviewed. Sinus rhythm, T wave inversion V3, no prior for comparison.  Assessment/Plan Principal Problem:   Acute pulmonary embolism without acute cor pulmonale (HCC) Active Problems:   GERD (gastroesophageal reflux disease)   Prothrombin gene mutation (HCC)  ATUL DELUCIA is a 48 y.o. male with medical history significant for heterozygous prothrombin gene mutation and GERD who is admitted with acute pulmonary embolism.  Acute pulmonary embolism in setting of heterozygous prothrombin gene mutation: In the setting of increased VTE risk from prothrombin gene mutation.  Possible patient had a right lower extremity DVT which has embolized per his history.  Patient was otherwise hemodynamically stable and saturating well on room air. -Continue IV heparin gtt overnight, hold aspirin -Can likely transition to oral  oral anticoagulation tomorrow -Will need to be on anticoagulation at least 3-6 months and possibly indefinitely -advised patient follow-up with hematology on discharge -Obtain echocardiogram, RLE Doppler -Monitor on telemetry -Supplemental O2 as needed  GERD: Continue Pepcid.  DVT prophylaxis: Heparin gtt Code Status: Full code, confirmed with patient Family Communication: Discussed with patient, he has discussed with his family Disposition Plan: Likely discharge to home in 1-2 days Consults called: EDP discussed with PCCM Admission status: Observation   Zada Finders MD [facility]  If 7PM-7AM, please contact night-coverage [email or web address]  09/26/2019, 1:17 AM

## 2019-09-26 NOTE — Progress Notes (Signed)
PROGRESS NOTE  Shane Graham WUJ:[id number] DOB: 29-Oct-1970 DOA: 09/25/2019 PCP: Wilfrid Lund, PA  HPI/Recap of past 24 hours: HPI from Dr Henrine Screws is a 48 y.o. male with medical history significant for heterozygous prothrombin gene mutation and GERD who presents to the ED for evaluation of  denies for the past 2 weeks. Patient reports a strong family history of blood clots in his mother and his brother.  His brother was diagnosed for a blood clot this past summer and treated.  He had a work-up which revealed a prothrombin gene mutation therefore patient was also tested due to possible genetic component.  Per hematology, Dr. Clyda Greener note 09/06/2019 patient tested positive for heterozygous prothrombin gene mutation (SingleProthrombinG-20210-A mutation Identified). Due to increased risk for VTE patient started a low-dose aspirin 81 mg daily. Patient went to urgent care initially for further evaluation.  A D-dimer was obtained and was elevated to 8.33 and he was subsequently advised to come to the ED for further evaluation and management. In the ED, SPO2 98% on room air. CTA chest PE study was positive for acute pulmonary emboli in the distal main pulmonary artery extending into lobar and segmental branches and left lower lobar pulmonary artery extending into segmental and subsegmental branches.  Per radiology read, RV/LV ratio is 1.0 suggestive of mild right heart strain. Patient was started on IV heparin.  EDP discussed with on-call PCCM who recommended admission to stepdown unit under hospitalist service as patient is currently hemodynamically stable.      Today, patient denies any new complaints, just reports some palpitations and some shortness of breath, denies it worsening.  Patient denies any chest pain, abdominal pain, nausea/vomiting, fever/chills, cough.     Assessment/Plan: Principal Problem:   Acute pulmonary embolism without acute cor pulmonale (HCC) Active Problems:   GERD (gastroesophageal reflux disease)   Prothrombin gene mutation (HCC)   Acute PE in the setting of heterozygous prothrombin gene mutation Patient hemodynamically stable, saturating well on room air Echo showed EF of 60 to 65%, otherwise unremarkable Venous Doppler showed acute DVT involving the right femoral, peroneal, posterior tibial veins, also age indeterminant DVT involving the left, common femoral vein, with chronic DVT involving the left femoral, popliteal, posterior tibial, peroneal veins Continue IV heparin, hold aspirin Plan to switch to NOAC on 09/27/2019, may require lifelong Monitor closely on telemetry Supplemental oxygen as needed  GERD Continue Pepcid         Malnutrition Type:      Malnutrition Characteristics:      Nutrition Interventions:       Estimated body mass index is 27.26 kg/m as calculated from the following:   Height as of this encounter:  (1.778 m).   Weight as of this encounter: 86.2 kg.     Code Status: Full  Family Communication: None at bedside  Disposition Plan: Likely home   Consultants:  None  Procedures:  None  Antimicrobials:  None  DVT prophylaxis: Heparin   Objective: Vitals:   09/26/19 1430 09/26/19 1500 09/26/19 1530 09/26/19 1647  BP: 136/86 121/85 121/85 125/80  Pulse: 72  77   Resp: 18  18   Temp:    98 F (36.7 C)  TempSrc:    Oral  SpO2: 96%  96% 98%  Weight:      Height:        Intake/Output Summary (Last 24 hours) at 09/26/2019 1735 Last data filed at 09/26/2019 1700 Gross per 24 hour  Intake 410.78 ml  Output --  Net 410.78 ml   Filed Weights   09/26/19 0020  Weight: 86.2 kg    Exam:  General: NAD   Cardiovascular: S1, S2 present  Respiratory: CTAB  Abdomen: Soft, nontender, nondistended, bowel sounds present  Musculoskeletal: No bilateral pedal edema noted  Skin: Normal  Psychiatry: Normal mood   Data Reviewed: CBC: Recent Labs  Lab  09/25/19 2143 09/26/19 0511  WBC 7.1 6.1  HGB 14.3 13.4  HCT 43.9 40.7  MCV 95.2 94.7  PLT 253 234   Basic Metabolic Panel: Recent Labs  Lab 09/25/19 2143 09/26/19 0511  NA 141 139  K 3.7 5.0  CL 106 108  CO2 24 24  GLUCOSE 103* 101*  BUN 17 22*  CREATININE 1.34* 0.98  CALCIUM 9.4 8.9   GFR: Estimated Creatinine Clearance: 93.1 mL/min (by C-G formula based on SCr of 0.98 mg/dL). Liver Function Tests: Recent Labs  Lab 09/25/19 2143  AST 29  ALT 32  ALKPHOS 85  BILITOT 0.2*  PROT 7.7  ALBUMIN 4.3   No results for input(s): LIPASE, AMYLASE in the last 168 hours. No results for input(s): AMMONIA in the last 168 hours. Coagulation Profile: Recent Labs  Lab 09/26/19 0029  INR 1.0   Cardiac Enzymes: No results for input(s): CKTOTAL, CKMB, CKMBINDEX, TROPONINI in the last 168 hours. BNP (last 3 results) No results for input(s): PROBNP in the last 8760 hours. HbA1C: No results for input(s): HGBA1C in the last 72 hours. CBG: No results for input(s): GLUCAP in the last 168 hours. Lipid Profile: No results for input(s): CHOL, HDL, LDLCALC, TRIG, CHOLHDL, LDLDIRECT in the last 72 hours. Thyroid Function Tests: No results for input(s): TSH, T4TOTAL, FREET4, T3FREE, THYROIDAB in the last 72 hours. Anemia Panel: No results for input(s): VITAMINB12, FOLATE, FERRITIN, TIBC, IRON, RETICCTPCT in the last 72 hours. Urine analysis: No results found for: COLORURINE, APPEARANCEUR, LABSPEC, PHURINE, GLUCOSEU, HGBUR, BILIRUBINUR, KETONESUR, PROTEINUR, UROBILINOGEN, NITRITE, LEUKOCYTESUR Sepsis Labs: @LABRCNTIP (procalcitonin:4,lacticidven:4)  ) Recent Results (from the past 240 hour(s))  SARS CORONAVIRUS 2 (TAT 6-24 HRS) Nasopharyngeal Nasopharyngeal Swab     Status: None   Collection Time: 09/26/19 12:29 AM   Specimen: Nasopharyngeal Swab  Result Value Ref Range Status   SARS Coronavirus 2 NEGATIVE NEGATIVE Final    Comment: (NOTE) SARS-CoV-2 target nucleic acids are NOT  DETECTED. The SARS-CoV-2 RNA is generally detectable in upper and lower respiratory specimens during the acute phase of infection. Negative results do not preclude SARS-CoV-2 infection, do not rule out co-infections with other pathogens, and should not be used as the sole basis for treatment or other patient management decisions. Negative results must be combined with clinical observations, patient history, and epidemiological information. The expected result is Negative. Fact Sheet for Patients: 09/28/19 Fact Sheet for Healthcare Providers: HairSlick.no This test is not yet approved or cleared by the quierodirigir.com FDA and  has been authorized for detection and/or diagnosis of SARS-CoV-2 by FDA under an Emergency Use Authorization (EUA). This EUA will remain  in effect (meaning this test can be used) for the duration of the COVID-19 declaration under Section 56 4(b)(1) of the Act, 21 U.S.C. section 360bbb-3(b)(1), unless the authorization is terminated or revoked sooner. Performed at [facility], [address], [place], [place]       Studies: CTA Chest for PE  Result Date: 09/26/2019 CLINICAL DATA:  Shortness of breath. D-dimer. Patient reports shortness of breath with exercise for 2 weeks.  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 EXAM: CT ANGIOGRAPHY CHEST WITH CONTRAST TECHNIQUE: Multidetector CT imaging of the chest was performed using the standard protocol during bolus administration of intravenous contrast. Multiplanar CT image reconstructions and MIPs were obtained to evaluate the vascular anatomy. CONTRAST:  OMNIPAQUE IOHEXOL 350 MG/ML SOLN COMPARISON:  Chest CTA 11/02/2005. No interval exams. FINDINGS: Cardiovascular: Positive for acute pulmonary emboli. Filling defect in the distal main pulmonary artery extending into lobar and segmental branches. Filling  defects in the left lower lobar pulmonary artery extending into segmental and subsegmental branches. Thromboembolic burden is moderate. RV to LV ratio is 1.0. No pericardial effusion. No aortic dissection. Conventional branching pattern from the aortic arch. Mediastinum/Nodes: No enlarged mediastinal or hilar lymph nodes. Tiny hiatal hernia, esophagus is decompressed. No visualized thyroid nodule. Lungs/Pleura: No evidence of pulmonary infarct or focal airspace disease. No pleural fluid. No pulmonary edema. Trachea and mainstem bronchi are patent. Motion artifact at the lung bases partially obscures evaluation. Upper Abdomen: No acute findings. Musculoskeletal: There are no acute or suspicious osseous abnormalities. Review of the MIP images confirms the above findings. IMPRESSION: 1. Positive for acute PE with CT evidence of mild right heart strain (RV/LV Ratio = 1.0) consistent with at least submassive (intermediate risk) PE. The presence of right heart strain has been associated with an increased risk of morbidity and mortality. Please activate Code PE by paging [phone]. 2. No pulmonary infarct or other acute intrathoracic abnormality. Critical Value/emergent results were called by telephone at the time of interpretation on 09/26/2019 at 12:12 am to Dr Kennis Carina , who verbally acknowledged these results. Electronically Signed   By: Narda Rutherford M.D.   On: 09/26/2019 00:12   ECHOCARDIOGRAM COMPLETE  Result Date: 09/26/2019   ECHOCARDIOGRAM REPORT   Patient Name:   Shane Graham Date of Exam: 09/26/2019 Medical Rec #:  [id number]     Height:       72.0 in Accession #:    [id number]    Weight:       175.0 lb Date of Birth:  Nov 05, 1970     BSA:          2.01 m Patient Age:    48 years      BP:           118/79 mmHg Patient Gender: M             HR:           70 bpm. Exam Location:  Inpatient Procedure: 2D Echo, Cardiac Doppler and Color Doppler Indications:    I26.02 Pulmonary embolus  History:         Patient has no prior history of Echocardiogram examinations.  Sonographer:    Sheralyn Boatman RDCS Referring Phys: [id number] VISHAL R PATEL IMPRESSIONS  1. Left ventricular ejection fraction, by visual estimation, is 60 to 65%. The left ventricle has normal function. There is borderline left ventricular hypertrophy.  2. The left ventricle has no regional wall motion abnormalities.  3. Global right ventricle has normal systolic function.The right ventricular size is normal. No increase in right ventricular wall thickness.  4. Left atrial size was normal.  5. Right atrial size was normal.  6. The pericardial effusion is circumferential.  7. Trivial pericardial effusion is present.  8. The mitral valve is grossly normal. Trivial mitral valve regurgitation.  9. The tricuspid valve is grossly normal. 10. The aortic valve is tricuspid. Aortic valve regurgitation is not  visualized. No evidence of aortic valve sclerosis or stenosis. 11. The pulmonic valve was grossly normal. Pulmonic valve regurgitation is not visualized. 12. Aortic dilatation noted. 13. There is mild dilatation of the aortic root measuring 38 mm. 14. TR signal is inadequate for assessing pulmonary artery systolic pressure. 15. The inferior vena cava is normal in size with greater than 50% respiratory variability, suggesting right atrial pressure of 3 mmHg. 16. No prior Echocardiogram. FINDINGS  Left Ventricle: Left ventricular ejection fraction, by visual estimation, is 60 to 65%. The left ventricle has normal function. The left ventricle has no regional wall motion abnormalities. The left ventricular internal cavity size was the left ventricle is normal in size. There is borderline left ventricular hypertrophy. Left ventricular diastolic parameters were normal. Normal left atrial pressure. Right Ventricle: The right ventricular size is normal. No increase in right ventricular wall thickness. Global RV systolic function is has normal systolic function. Left  Atrium: Left atrial size was normal in size. Right Atrium: Right atrial size was normal in size Pericardium: Trivial pericardial effusion is present. The pericardial effusion is circumferential. Mitral Valve: The mitral valve is grossly normal. Trivial mitral valve regurgitation. Tricuspid Valve: The tricuspid valve is grossly normal. Tricuspid valve regurgitation is trivial. Aortic Valve: The aortic valve is tricuspid. Aortic valve regurgitation is not visualized. The aortic valve is structurally normal, with no evidence of sclerosis or stenosis. Pulmonic Valve: The pulmonic valve was grossly normal. Pulmonic valve regurgitation is not visualized. Pulmonic regurgitation is not visualized. Aorta: Aortic dilatation noted. There is mild dilatation of the aortic root measuring 38 mm. Venous: The inferior vena cava is normal in size with greater than 50% respiratory variability, suggesting right atrial pressure of 3 mmHg. IAS/Shunts: No atrial level shunt detected by color flow Doppler.  LEFT VENTRICLE PLAX 2D LVIDd:         4.10 cm LVIDs:         2.40 cm LV PW:         1.10 cm LV IVS:        1.20 cm LVOT diam:     2.10 cm LV SV:         54 ml LV SV Index:   26.89 LVOT Area:     3.46 cm  RIGHT VENTRICLE RV S prime:     12.80 cm/s TAPSE (M-mode): 2.2 cm LEFT ATRIUM           Index LA Vol (A2C): 59.2 ml 29.41 ml/m LA Vol (A4C): 35.3 ml 17.54 ml/m  AORTIC VALVE LVOT VTI:    1.930 m  AORTA Ao Root diam: 3.80 cm MV E velocity: 70.70 cm/s 103 cm/s MV A velocity: 62.60 cm/s 70.3 cm/s SHUNTS MV E/A ratio:  1.13       1.5       Systemic VTI:  1.93 m                                     Systemic Diam: 2.10 cm  Eleonore Chiquito MD Electronically signed by Eleonore Chiquito MD Signature Date/Time: 09/26/2019/2:21:52 PM    Final    VAS [place] LOWER EXTREMITY VENOUS (DVT)  Result Date: 09/26/2019  Lower Venous Study Indications: Pulmonary embolism.  Comparison Study: no prior Performing Technologist: Abram Sander RVS  Examination  Guidelines: A complete evaluation includes B-mode imaging, spectral Doppler, color Doppler, and power Doppler as needed of all accessible portions of  each vessel. Bilateral testing is considered an integral part of a complete examination. Limited examinations for reoccurring indications may be performed as noted.  +---------+---------------+---------+-----------+----------+--------------+  RIGHT     Compressibility Phasicity Spontaneity Properties Thrombus Aging  +---------+---------------+---------+-----------+----------+--------------+  CFV       Full            Yes       Yes                                    +---------+---------------+---------+-----------+----------+--------------+  SFJ       Full                                                             +---------+---------------+---------+-----------+----------+--------------+  FV Prox   Full                                                             +---------+---------------+---------+-----------+----------+--------------+  FV Mid    None                                             Acute           +---------+---------------+---------+-----------+----------+--------------+  FV Distal None                                             Acute           +---------+---------------+---------+-----------+----------+--------------+  PFV       Full                                                             +---------+---------------+---------+-----------+----------+--------------+  POP       None            No        No                     Acute           +---------+---------------+---------+-----------+----------+--------------+  PTV       None                                             Acute           +---------+---------------+---------+-----------+----------+--------------+  PERO      None  Acute           +---------+---------------+---------+-----------+----------+--------------+    +---------+---------------+---------+-----------+----------+-----------------+  LEFT      Compressibility Phasicity Spontaneity Properties Thrombus Aging     +---------+---------------+---------+-----------+----------+-----------------+  CFV       Partial         Yes       Yes                    Age Indeterminate  +---------+---------------+---------+-----------+----------+-----------------+  SFJ       Full                                                                +---------+---------------+---------+-----------+----------+-----------------+  FV Prox   Partial                                          Chronic            +---------+---------------+---------+-----------+----------+-----------------+  FV Mid    Partial                                          Chronic            +---------+---------------+---------+-----------+----------+-----------------+  FV Distal Partial                                          Chronic            +---------+---------------+---------+-----------+----------+-----------------+  PFV       Full                                                                +---------+---------------+---------+-----------+----------+-----------------+  POP       Partial         Yes       Yes                    Chronic            +---------+---------------+---------+-----------+----------+-----------------+  PTV       Partial                                          Chronic            +---------+---------------+---------+-----------+----------+-----------------+  PERO      Partial                                          Chronic            +---------+---------------+---------+-----------+----------+-----------------+     Summary: Right: Findings consistent with acute deep  vein thrombosis involving the right femoral vein, right peroneal veins, and right posterior tibial veins. No cystic structure found in the popliteal fossa. Left: Findings consistent with age indeterminate deep vein thrombosis  involving the left common femoral vein. Findings consistent with chronic deep vein thrombosis involving the left femoral vein, left popliteal vein, left posterior tibial veins, and left peroneal veins. No cystic structure found in the popliteal fossa.  *See table(s) above for measurements and observations.    Preliminary     Scheduled Meds:  famotidine  20 mg Oral BID   sodium chloride flush  3 mL Intravenous Q12H    Continuous Infusions:  heparin 1,500 Units/hr (09/26/19 1700)     LOS: 0 days     Briant Cedar, MD [facility]  If 7PM-7AM, please contact night-coverage [email or web address] 09/26/2019, 5:35 PM

## 2019-09-26 NOTE — Progress Notes (Signed)
ANTICOAGULATION CONSULT NOTE - Initial Consult  Pharmacy Consult for Heparin Indication: pulmonary embolus  Allergies  Allergen Reactions  . Bee Venom Hives  . Other     Hayfever and Allergy Relief OTC causes eye redness.    Patient Measurements: Height: 5\' 10"  (177.8 cm) Weight: 190 lb (86.2 kg) IBW/kg (Calculated) : 73 Heparin Dosing Weight:   Vital Signs: Temp: 97.8 F (36.6 C) (12/29 2125) Temp Source: Oral (12/29 2125) BP: 125/89 (12/30 0800) Pulse Rate: 74 (12/30 0800)  Labs: Recent Labs    09/25/19 2143 09/25/19 2344 09/26/19 0029 09/26/19 0511 09/26/19 0848  HGB 14.3  --   --  13.4  --   HCT 43.9  --   --  40.7  --   PLT 253  --   --  234  --   APTT  --   --  28  --   --   LABPROT  --   --  13.4  --   --   INR  --   --  1.0  --   --   HEPARINUNFRC  --   --   --   --  0.65  CREATININE 1.34*  --   --  0.98  --   TROPONINIHS 13 13  --   --   --     Estimated Creatinine Clearance: 93.1 mL/min (by C-G formula based on SCr of 0.98 mg/dL).   Medical History: Past Medical History:  Diagnosis Date  . Complication of anesthesia   . Constipation   . GERD (gastroesophageal reflux disease)    takes tums  . H/O hiatal hernia   . Headache(784.0)    not recently  . Hemorrhoid   . Neck pain   . PONV (postoperative nausea and vomiting)     Medications:  Infusions:  . heparin 1,500 Units/hr (09/26/19 0047)    Assessment: 48 yo M presents with SOB. Found to have PE. Started on heparin gtt. First heparin level therapeutic at 0.65. CBC stable.   Goal of Therapy:  Heparin level 0.3-0.7 units/ml Monitor platelets by anticoagulation protocol: Yes   Plan:  Continue heparin drip at 1,500 units/hr Next heparin level at 1400 Monitor daily heparin level, CBC, s/s of bleed F/U transition to PO anticoag    Veer Elamin J 09/26/2019,9:19 AM

## 2019-09-26 NOTE — Progress Notes (Signed)
  Echocardiogram 2D Echocardiogram has been performed.  Bobbye Charleston 09/26/2019, 12:05 PM

## 2019-09-27 DIAGNOSIS — D6852 Prothrombin gene mutation: Secondary | ICD-10-CM

## 2019-09-27 DIAGNOSIS — I82403 Acute embolism and thrombosis of unspecified deep veins of lower extremity, bilateral: Secondary | ICD-10-CM

## 2019-09-27 DIAGNOSIS — K219 Gastro-esophageal reflux disease without esophagitis: Secondary | ICD-10-CM

## 2019-09-27 LAB — NOVEL CORONAVIRUS, NAA (HOSP ORDER, SEND-OUT TO REF LAB; TAT 18-24 HRS): SARS-CoV-2, NAA: NOT DETECTED

## 2019-09-27 LAB — BASIC METABOLIC PANEL
Anion gap: 11 (ref 5–15)
BUN: 18 mg/dL (ref 6–20)
CO2: 22 mmol/L (ref 22–32)
Calcium: 8.9 mg/dL (ref 8.9–10.3)
Chloride: 104 mmol/L (ref 98–111)
Creatinine, Ser: 0.78 mg/dL (ref 0.61–1.24)
GFR calc Af Amer: >60 mL/min (ref >60)
GFR calc non Af Amer: >60 mL/min (ref >60)
Glucose, Bld: 94 mg/dL (ref 70–99)
Potassium: 3.8 mmol/L (ref 3.5–5.1)
Sodium: 137 mmol/L (ref 135–145)

## 2019-09-27 LAB — CBC
HCT: 42.9 % (ref 39.0–52.0)
Hemoglobin: 14.2 g/dL (ref 13.0–17.0)
MCH: 31.3 pg (ref 26.0–34.0)
MCHC: 33.1 g/dL (ref 30.0–36.0)
MCV: 94.7 fL (ref 80.0–100.0)
Platelets: 233 10*3/uL (ref 150–400)
RBC: 4.53 MIL/uL (ref 4.22–5.81)
RDW: 12.7 % (ref 11.5–15.5)
WBC: 6 10*3/uL (ref 4.0–10.5)
nRBC: 0 % (ref 0.0–0.2)

## 2019-09-27 LAB — MRSA PCR SCREENING: MRSA by PCR: NEGATIVE

## 2019-09-27 LAB — HEPARIN LEVEL (UNFRACTIONATED): Heparin Unfractionated: 0.59 IU/mL (ref 0.30–0.70)

## 2019-09-27 MED ORDER — APIXABAN (ELIQUIS) VTE STARTER PACK (10MG AND 5MG): ORAL_TABLET | ORAL | 0 refills | Status: DC

## 2019-09-27 MED ORDER — APIXABAN 5 MG PO TABS
10.0000 mg | ORAL_TABLET | Freq: Two times a day (BID) | ORAL | Status: DC
  Administered 2019-09-27: 12:00:00 10 mg via ORAL
  Filled 2019-09-27: qty 2

## 2019-09-27 MED ORDER — APIXABAN 5 MG PO TABS: 5.0000 mg | ORAL_TABLET | Freq: Two times a day (BID) | ORAL | Status: DC

## 2019-09-27 NOTE — Progress Notes (Signed)
[place] for apixaban Indication: pulmonary embolus  Allergies  Allergen Reactions  . Bee Venom Hives  . Other     Hayfever and Allergy Relief OTC causes eye redness.    Patient Measurements: Height: 5\' 10"  (177.8 cm) Weight: 181 lb 10.5 oz (82.4 kg) IBW/kg (Calculated) : 73 Heparin Dosing Weight:   Vital Signs: Temp: 97.6 F (36.4 C) (12/31 0800) Temp Source: Oral (12/31 0800) BP: 118/97 (12/31 0800) Pulse Rate: 73 (12/31 0800)  Labs: Recent Labs    09/25/19 2143 09/25/19 2143 09/25/19 2344 09/26/19 0029 09/26/19 0511 09/26/19 1626 09/26/19 1815 09/26/19 2311 09/27/19 0155  HGB 14.3  --   --   --  13.4  --  14.4  --  14.2  HCT 43.9  --   --   --  40.7  --  43.3  --  42.9  PLT 253  --   --   --  234  --  263  --  233  APTT  --   --   --  28  --   --   --   --   --   LABPROT  --   --   --  13.4  --   --   --   --   --   INR  --   --   --  1.0  --   --   --   --   --   HEPARINUNFRC  --    < >  --   --   --  <0.10*  --  0.46 0.59  CREATININE 1.34*  --   --   --  0.98  --   --   --  0.78  TROPONINIHS 13  --  13  --   --   --   --   --   --    < > = values in this interval not displayed.    Estimated Creatinine Clearance: 114.1 mL/min (by C-G formula based on SCr of 0.78 mg/dL).   Medical History: Past Medical History:  Diagnosis Date  . Complication of anesthesia   . Constipation   . GERD (gastroesophageal reflux disease)    takes tums  . H/O hiatal hernia   . Headache(784.0)    not recently  . Hemorrhoid   . Neck pain   . PONV (postoperative nausea and vomiting)        Assessment: 48 yo M with known prothrombin gene mutation (heterozygous for prothrombin G20210A) and family history of thrombotic events, on ASA 81 mg daily, presented with ShOB, CT angio positive for acute PE.  Pharmacy was consulted for IV heparin dosing.   Today, 09/27/2019: Heparin level remains therapeutic on heparin infusion at 1500  units/hr. No bleeding or infusion-related problems per RN report. Hemodynamically stable. H/H wnl Pltc wnl Update: New order from attending MD to convert patient to apixaban  Plan:  Apixaban 10 mg PO BID x 7 days, then 5 mg PO BID DC heparin (stop infusion when first apixaban dose administered) Patient medication teaching completed.   Vouchers provided for first 30 day prescription at no cost and then a potentially reduced co-pay.  Clayburn Pert, PharmD, BCPS 09/27/2019  11:13 AM

## 2019-09-27 NOTE — Progress Notes (Signed)
Spoke to Pharmacist concerning Heparin levels. Instructed to stay at 15 since therapeutic level has been reached and re-test Heparin level in the morning.

## 2019-09-27 NOTE — Discharge Instructions (Signed)
Information on my medicine - ELIQUIS (apixaban)  WHY WAS ELIQUIS PRESCRIBED FOR YOU? Eliquis was prescribed to treat blood clots that may have been found in the veins of your legs (deep vein thrombosis) or in your lungs (pulmonary embolism) and to reduce the risk of them occurring again.  WHAT DO YOU NEED TO KNOW ABOUT ELIQUIS ? The starting dose is 10 mg (two 5 mg tablets) taken TWICE daily for the FIRST SEVEN (7) DAYS, then on 10/04/2019 the dose is reduced to ONE 5 mg tablet taken TWICE daily. Eliquis may be taken with or without food. Try to take the dose about the same time in the morning and in the evening. If you have difficulty swallowing the tablet whole please discuss with your pharmacist how to take the medication safely. Take Eliquis exactly as prescribed and DO NOT stop taking Eliquis without talking to the doctor who prescribed the medication. Stopping may increase your risk of developing a new blood clot. Refill your prescription before you run out. After discharge, you should have regular check-up appointments with your healthcare provider that is prescribing your Eliquis.  WHAT DO YOU DO IF YOU MISS A DOSE? If a dose of ELIQUIS is not taken at the scheduled time, take it as soon as possible on the same day and twice-daily administration should be resumed. The dose should not be doubled to make up for a missed dose.  IMPORTANT SAFETY INFORMATION A possible side effect of Eliquis is bleeding. You should call your healthcare provider right away if you experience any of the following: -Bleeding from an injury or your nose that does not stop. -Unusual colored urine (red or dark brown) or unusual colored stools (red or black). -Unusual bruising for unknown reasons. -A serious fall or if you hit your head (even if there is no bleeding). Some medicines may interact with Eliquis and might increase your risk of bleeding or clotting while on Eliquis. To help avoid  this, consult your healthcare provider or pharmacist prior to using any new prescription or non-prescription medications, including herbals, vitamins, non-steroidal anti-inflammatory drugs (NSAIDs) and supplements.  This website has more information on Eliquis (apixaban): [email or web address].com

## 2019-09-27 NOTE — Progress Notes (Signed)
[place] for Heparin Indication: pulmonary embolus  Allergies  Allergen Reactions  . Bee Venom Hives  . Other     Hayfever and Allergy Relief OTC causes eye redness.    Patient Measurements: Height: 5\' 10"  (177.8 cm) Weight: 181 lb 10.5 oz (82.4 kg) IBW/kg (Calculated) : 73 Heparin Dosing Weight:   Vital Signs: Temp: 97.3 F (36.3 C) (12/31 0400) Temp Source: Oral (12/31 0400) BP: 112/88 (12/31 0500) Pulse Rate: 63 (12/31 0500)  Labs: Recent Labs    09/25/19 2143 09/25/19 2143 09/25/19 2344 09/26/19 0029 09/26/19 0511 09/26/19 1626 09/26/19 1815 09/26/19 2311 09/27/19 0155  HGB 14.3  --   --   --  13.4  --  14.4  --  14.2  HCT 43.9  --   --   --  40.7  --  43.3  --  42.9  PLT 253  --   --   --  234  --  263  --  233  APTT  --   --   --  28  --   --   --   --   --   LABPROT  --   --   --  13.4  --   --   --   --   --   INR  --   --   --  1.0  --   --   --   --   --   HEPARINUNFRC  --    < >  --   --   --  <0.10*  --  0.46 0.59  CREATININE 1.34*  --   --   --  0.98  --   --   --  0.78  TROPONINIHS 13  --  13  --   --   --   --   --   --    < > = values in this interval not displayed.    Estimated Creatinine Clearance: 114.1 mL/min (by C-G formula based on SCr of 0.78 mg/dL).   Medical History: Past Medical History:  Diagnosis Date  . Complication of anesthesia   . Constipation   . GERD (gastroesophageal reflux disease)    takes tums  . H/O hiatal hernia   . Headache(784.0)    not recently  . Hemorrhoid   . Neck pain   . PONV (postoperative nausea and vomiting)     Medications:  Infusions:  . 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  Pharmacy was consulted for IV heparin dosing.   Today,  09/27/2019: Heparin level remains therapeutic on heparin infusion at 1500 units/hr. No bleeding or infusion-related problems per RN report. Hemodynamically stable. H/H wnl Pltc wnl   Goal of Therapy:  Heparin level 0.3-0.7 units/ml Monitor platelets by anticoagulation protocol: Yes   Plan:  Continue heparin drip at 1,500 units/hr Monitor daily heparin level, CBC, any reports of bleeding F/U for transition to PO anticoag   Clayburn Pert, PharmD, BCPS 09/27/2019  6:31 AM

## 2019-09-27 NOTE — TOC Benefit Eligibility Note (Signed)
Transition of Care [facility]) Benefit Eligibility Note    Patient Details  Name: Shane Graham MRN: [id number] Date of Birth: 11-Sep-1971   Medication/Dose: Eliquis 66m 2x day for 30days  Covered?: Yes  Tier: 3 Drug  Prescription Coverage Preferred Pharmacy: local pharmacy  Spoke with Person/Company/Phone Number:: Sylvia[employer] [phone] Co-Pay: $48.45  Prior Approval: No  Deductible: Met       FKerin SalenPhone Number: 09/27/2019, 11:02 AM

## 2019-09-27 NOTE — Discharge Summary (Signed)
Discharge Summary  Shane Graham ZOX:[id number] DOB: [birth year]  PCP: Shane Lund, PA  Admit date: 09/25/2019 Discharge date: 09/27/2019  Time spent: 35 mins  Recommendations for Outpatient Follow-up:  1. PCP in 1 week 2. Heme-onc Shane Graham as scheduled  Discharge Diagnoses:  Active Hospital Problems   Diagnosis Date Noted  . Acute pulmonary embolism without acute cor pulmonale (HCC) 09/26/2019  . GERD (gastroesophageal reflux disease)   . Prothrombin gene mutation Shane Graham)     Resolved Hospital Problems  No resolved problems to display.    Discharge Condition: Stable  Diet recommendation: Heart healthy  Vitals:   09/27/19 0700 09/27/19 0800  BP: 131/85 (!) 118/97  Pulse: 64 73  Resp: 20 16  Temp:  97.6 F (36.4 C)  SpO2: 95% 93%    History of present illness:  Shane Graham a 48 y.o.malewith medical history significant forheterozygous prothrombin gene mutation and GERD who presents to the ED for evaluation of denies for the past 2 weeks. Patient reports a strong family history of blood clots in his mother and his brother. His brother was diagnosed for a blood clot this past summer and treated. He had a work-up which revealed a prothrombin gene mutation therefore patient was also tested due to possible genetic component. Per hematology, Shane Graham 09/06/2019 patient tested positive for heterozygous prothrombin gene mutation (SingleProthrombinG-20210-A mutation Identified). Due to increased risk for VTE patient started a low-dose aspirin 81 mg daily. Patient went to urgent care initially for further evaluation. A D-dimer was obtained and was elevated to 8.33 and he was subsequently advised to come to the ED for further evaluation and management. In the ED, SPO2 98% on room air. CTA chest PE study was positive for acute pulmonary emboli in the distal main pulmonary artery extending into lobar and segmental branches and left lower lobar pulmonary artery  extending into segmental and subsegmental branches. Per radiology read, RV/LV ratio is 1.0 suggestive of mild right heart strain. Patient was started on IV heparin. Shane Graham discussed with on-call PCCM who recommended admission to stepdown unit under hospitalist service as patient is currently hemodynamically stable.      Today, patient denies any new complaints, denies any shortness of breath, chest pain, abdominal pain, nausea/vomiting, fever/chills, cough, hematuria.  Patient ambulated to the bathroom and back without any issues.  Patient stable to be discharged on apixaban, advised to stop aspirin while on apixaban.  Advised to follow-up with PCP in 1 week as well as heme-onc as scheduled.  Hospital Course:  Principal Problem:   Acute pulmonary embolism without acute cor pulmonale (HCC) Active Problems:   GERD (gastroesophageal reflux disease)   Prothrombin gene mutation (HCC)   Acute PE in the setting of heterozygous prothrombin gene mutation Patient hemodynamically stable, saturating well on room air Echo showed EF of 60 to 65%, otherwise unremarkable Venous Doppler showed acute DVT involving the right femoral, peroneal, posterior tibial veins, also age indeterminant DVT involving the left, common femoral vein, with chronic DVT involving the left femoral, popliteal, posterior tibial, peroneal veins S/P IV heparin, switch to Eliquis, DC aspirin Follow-up with PCP in 1 week Follow-up with heme-onc as scheduled  GERD Continue Pepcid            Malnutrition Type:      Malnutrition Characteristics:      Nutrition Interventions:      Estimated body mass index is 26.07 kg/m as calculated from the following:   Height as of this encounter: 5'  10" (1.778 m).   Weight as of this encounter: 82.4 kg.    Procedures:  None  Consultations:  None  Discharge Exam: BP (!) 118/97   Pulse 73   Temp 97.6 F (36.4 C) (Oral)   Resp 16   Ht 5\' 10"  (1.778 m)   Wt 82.4  kg   SpO2 93%   BMI 26.07 kg/m   General: NAD Cardiovascular: S1, S2 present Respiratory: CTA B  Discharge Instructions You were cared for by a hospitalist during your hospital stay. If you have any questions about your discharge medications or the care you received while you were in the hospital after you are discharged, you can call the unit and asked to speak with the hospitalist on call if the hospitalist that took care of you is not available. Once you are discharged, your primary care physician will handle any further medical issues. Please note that NO REFILLS for any discharge medications will be authorized once you are discharged, as it is imperative that you return to your primary care physician (or establish a relationship with a primary care physician if you do not have one) for your aftercare needs so that they can reassess your need for medications and monitor your lab values.  Discharge Instructions    Diet - low sodium heart healthy   Complete by: As directed    Increase activity slowly   Complete by: As directed      Allergies as of 09/27/2019      Reactions   Bee Venom Hives   Other    Hayfever and Allergy Relief OTC causes eye redness.      Medication List    STOP taking these medications   aspirin EC 81 MG tablet     TAKE these medications   Apixaban Starter Pack 5 MG Tbpk Commonly known as: ELIQUIS STARTER PACK Take as directed on package: start with two-5mg  tablets twice daily for 7 days. On day 8, switch to one-5mg  tablet twice daily.   EPINEPHrine 0.3 MG/0.3ML Sosy Inject 1 each into the skin as needed (allergic reaction).   famotidine 20 MG tablet Commonly known as: PEPCID Take 20 mg by mouth 2 (two) times daily.   loratadine 10 MG tablet Commonly known as: CLARITIN Take 10 mg by mouth daily.   scopolamine 1 MG/3DAYS Commonly known as: TRANSDERM-SCOP Place 1 patch onto the skin daily as needed (sea sickness).      Allergies  Allergen  Reactions  . Bee Venom Hives  . Other     Hayfever and Allergy Relief OTC causes eye redness.   Follow-up Information    Shane Huxley, PA. Schedule an appointment as soon as possible for a visit in 1 week(s).   Specialty: Family Medicine Contact information: [place] [address] [phone]            The results of significant diagnostics from this hospitalization (including imaging, microbiology, ancillary and laboratory) are listed below for reference.    Significant Diagnostic Studies: CTA Chest for PE  Result Date: 09/26/2019 CLINICAL DATA:  Shortness of breath. D-dimer. Patient reports shortness of breath with exercise for 2 weeks. 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 EXAM: CT ANGIOGRAPHY CHEST WITH CONTRAST TECHNIQUE: Multidetector CT imaging of the chest was performed using the standard protocol during bolus administration of intravenous contrast. Multiplanar CT image reconstructions and MIPs were obtained to evaluate the vascular anatomy. CONTRAST:  157mL OMNIPAQUE  IOHEXOL 350 MG/ML SOLN COMPARISON:  Chest CTA 11/02/2005. No interval exams. FINDINGS: Cardiovascular: Positive for acute pulmonary emboli. Filling defect in the distal main pulmonary artery extending into lobar and segmental branches. Filling defects in the left lower lobar pulmonary artery extending into segmental and subsegmental branches. Thromboembolic burden is moderate. RV to LV ratio is 1.0. No pericardial effusion. No aortic dissection. Conventional branching pattern from the aortic arch. Mediastinum/Nodes: No enlarged mediastinal or hilar lymph nodes. Tiny hiatal hernia, esophagus is decompressed. No visualized thyroid nodule. Lungs/Pleura: No evidence of pulmonary infarct or focal airspace disease. No pleural fluid. No pulmonary edema. Trachea and mainstem bronchi are patent. Motion artifact at the lung bases partially obscures evaluation.  Upper Abdomen: No acute findings. Musculoskeletal: There are no acute or suspicious osseous abnormalities. Review of the MIP images confirms the above findings. IMPRESSION: 1. Positive for acute PE with CT evidence of mild right heart strain (RV/LV Ratio = 1.0) consistent with at least submassive (intermediate risk) PE. The presence of right heart strain has been associated with an increased risk of morbidity and mortality. Please activate Code PE by paging [phone]. 2. No pulmonary infarct or other acute intrathoracic abnormality. Critical Value/emergent results were called by telephone at the time of interpretation on 09/26/2019 at 12:12 am to Dr Kennis Carina , who verbally acknowledged these results. Electronically Signed   By: Narda Rutherford M.D.   On: 09/26/2019 00:12   ECHOCARDIOGRAM COMPLETE  Result Date: 09/26/2019   ECHOCARDIOGRAM REPORT   Patient Name:   Treshun J Pimenta Date of Exam: 09/26/2019 Medical Rec #:  [id number]     Height:       72.0 in Accession #:    [id number]    Weight:       175.0 lb Date of Birth:  [birth year]     BSA:          2.01 m Patient Age:    48 years      BP:           118/79 mmHg Patient Gender: M             HR:           70 bpm. Exam Location:  Inpatient Procedure: 2D Echo, Cardiac Doppler and Color Doppler Indications:    I26.02 Pulmonary embolus  History:        Patient has no prior history of Echocardiogram examinations.  Sonographer:    Sheralyn Boatman RDCS Referring Phys: [id number] VISHAL R PATEL IMPRESSIONS  1. Left ventricular ejection fraction, by visual estimation, is 60 to 65%. The left ventricle has normal function. There is borderline left ventricular hypertrophy.  2. The left ventricle has no regional wall motion abnormalities.  3. Global right ventricle has normal systolic function.The right ventricular size is normal. No increase in right ventricular wall thickness.  4. Left atrial size was normal.  5. Right atrial size was normal.  6. The pericardial effusion is  circumferential.  7. Trivial pericardial effusion is present.  8. The mitral valve is grossly normal. Trivial mitral valve regurgitation.  9. The tricuspid valve is grossly normal. 10. The aortic valve is tricuspid. Aortic valve regurgitation is not visualized. No evidence of aortic valve sclerosis or stenosis. 11. The pulmonic valve was grossly normal. Pulmonic valve regurgitation is not visualized. 12. Aortic dilatation noted. 13. There is mild dilatation of the aortic root measuring 38 mm. 14. TR signal is inadequate for assessing pulmonary artery systolic pressure. 15. The inferior vena  cava is normal in size with greater than 50% respiratory variability, suggesting right atrial pressure of 3 mmHg. 16. No prior Echocardiogram. FINDINGS  Left Ventricle: Left ventricular ejection fraction, by visual estimation, is 60 to 65%. The left ventricle has normal function. The left ventricle has no regional wall motion abnormalities. The left ventricular internal cavity size was the left ventricle is normal in size. There is borderline left ventricular hypertrophy. Left ventricular diastolic parameters were normal. Normal left atrial pressure. Right Ventricle: The right ventricular size is normal. No increase in right ventricular wall thickness. Global RV systolic function is has normal systolic function. Left Atrium: Left atrial size was normal in size. Right Atrium: Right atrial size was normal in size Pericardium: Trivial pericardial effusion is present. The pericardial effusion is circumferential. Mitral Valve: The mitral valve is grossly normal. Trivial mitral valve regurgitation. Tricuspid Valve: The tricuspid valve is grossly normal. Tricuspid valve regurgitation is trivial. Aortic Valve: The aortic valve is tricuspid. Aortic valve regurgitation is not visualized. The aortic valve is structurally normal, with no evidence of sclerosis or stenosis. Pulmonic Valve: The pulmonic valve was grossly normal. Pulmonic valve  regurgitation is not visualized. Pulmonic regurgitation is not visualized. Aorta: Aortic dilatation noted. There is mild dilatation of the aortic root measuring 38 mm. Venous: The inferior vena cava is normal in size with greater than 50% respiratory variability, suggesting right atrial pressure of 3 mmHg. IAS/Shunts: No atrial level shunt detected by color flow Doppler.  LEFT VENTRICLE PLAX 2D LVIDd:         4.10 cm LVIDs:         2.40 cm LV PW:         1.10 cm LV IVS:        1.20 cm LVOT diam:     2.10 cm LV SV:         54 ml LV SV Index:   26.89 LVOT Area:     3.46 cm  RIGHT VENTRICLE RV S prime:     12.80 cm/s TAPSE (M-mode): 2.2 cm LEFT ATRIUM           Index LA Vol (A2C): 59.2 ml 29.41 ml/m LA Vol (A4C): 35.3 ml 17.54 ml/m  AORTIC VALVE LVOT VTI:    1.930 m  AORTA Ao Root diam: 3.80 cm MV E velocity: 70.70 cm/s 103 cm/s MV A velocity: 62.60 cm/s 70.3 cm/s SHUNTS MV E/A ratio:  1.13       1.5       Systemic VTI:  1.93 m                                     Systemic Diam: 2.10 cm  Lennie Odor MD Electronically signed by Lennie Odor MD Signature Date/Time: 09/26/2019/2:21:52 PM    Final    VAS [place] LOWER EXTREMITY VENOUS (DVT)  Result Date: 09/26/2019  Lower Venous Study Indications: Pulmonary embolism.  Comparison Study: no prior Performing Technologist: Blanch Media RVS  Examination Guidelines: A complete evaluation includes B-mode imaging, spectral Doppler, color Doppler, and power Doppler as needed of all accessible portions of each vessel. Bilateral testing is considered an integral part of a complete examination. Limited examinations for reoccurring indications may be performed as noted.  +---------+---------------+---------+-----------+----------+--------------+ RIGHT    CompressibilityPhasicitySpontaneityPropertiesThrombus Aging +---------+---------------+---------+-----------+----------+--------------+ CFV      Full           Yes  Yes                                  +---------+---------------+---------+-----------+----------+--------------+ SFJ      Full                                                        +---------+---------------+---------+-----------+----------+--------------+ FV Prox  Full                                                        +---------+---------------+---------+-----------+----------+--------------+ FV Mid   None                                         Acute          +---------+---------------+---------+-----------+----------+--------------+ FV DistalNone                                         Acute          +---------+---------------+---------+-----------+----------+--------------+ PFV      Full                                                        +---------+---------------+---------+-----------+----------+--------------+ POP      None           No       No                   Acute          +---------+---------------+---------+-----------+----------+--------------+ PTV      None                                         Acute          +---------+---------------+---------+-----------+----------+--------------+ PERO     None                                         Acute          +---------+---------------+---------+-----------+----------+--------------+   +---------+---------------+---------+-----------+----------+-----------------+ LEFT     CompressibilityPhasicitySpontaneityPropertiesThrombus Aging    +---------+---------------+---------+-----------+----------+-----------------+ CFV      Partial        Yes      Yes                  Age Indeterminate +---------+---------------+---------+-----------+----------+-----------------+ SFJ      Full                                                           +---------+---------------+---------+-----------+----------+-----------------+  FV Prox  Partial                                      Chronic            +---------+---------------+---------+-----------+----------+-----------------+ FV Mid   Partial                                      Chronic           +---------+---------------+---------+-----------+----------+-----------------+ FV DistalPartial                                      Chronic           +---------+---------------+---------+-----------+----------+-----------------+ PFV      Full                                                           +---------+---------------+---------+-----------+----------+-----------------+ POP      Partial        Yes      Yes                  Chronic           +---------+---------------+---------+-----------+----------+-----------------+ PTV      Partial                                      Chronic           +---------+---------------+---------+-----------+----------+-----------------+ PERO     Partial                                      Chronic           +---------+---------------+---------+-----------+----------+-----------------+     Summary: Right: Findings consistent with acute deep vein thrombosis involving the right femoral vein, right peroneal veins, and right posterior tibial veins. No cystic structure found in the popliteal fossa. Left: Findings consistent with age indeterminate deep vein thrombosis involving the left common femoral vein. Findings consistent with chronic deep vein thrombosis involving the left femoral vein, left popliteal vein, left posterior tibial veins, and left peroneal veins. No cystic structure found in the popliteal fossa.  *See table(s) above for measurements and observations. Electronically signed by Sherald Hess MD on 09/26/2019 at 5:39:18 PM.    Final     Microbiology: Recent Results (from the past 240 hour(s))  Novel Coronavirus, NAA (Hosp order, Send-out to Ref Lab; TAT 18-24 hrs     Status: None   Collection Time: 09/25/19  6:55 PM   Specimen: Nasopharyngeal Swab; Respiratory  Result  Value Ref Range Status   SARS-CoV-2, NAA NOT DETECTED NOT DETECTED Final    Comment: (NOTE) This nucleic acid amplification test was developed and its performance characteristics determined by [employer]. Nucleic acid amplification tests include PCR and TMA. This test has not been FDA cleared or approved. This test has been authorized by FDA under an Emergency Use Authorization (EUA).  This test is only authorized for the duration of time the declaration that circumstances exist justifying the authorization of the emergency use of in vitro diagnostic tests for detection of SARS-CoV-2 virus and/or diagnosis of COVID-19 infection under section 564(b)(1) of the Act, 21 U.S.C. 161WRU-0(A) (1), unless the authorization is terminated or revoked sooner. When diagnostic testing is negative, the possibility of a false negative result should be considered in the context of a patient's recent exposures and the presence of clinical signs and symptoms consistent with COVID-19. An individual without symptoms of COVID- 19 and who is not shedding SARS-CoV-2 vi rus would expect to have a negative (not detected) result in this assay. Performed At: [facility] [address] [place], [place] [id number] Jolene Schimke MD YN:[id number]    Coronavirus Source NASOPHARYNGEAL  Final    Comment: Performed at [facility], [address], [place], [place] [address]  SARS CORONAVIRUS 2 (TAT 6-24 HRS) Nasopharyngeal Nasopharyngeal Swab     Status: None   Collection Time: 09/26/19 12:29 AM   Specimen: Nasopharyngeal Swab  Result Value Ref Range Status   SARS Coronavirus 2 NEGATIVE NEGATIVE Final    Comment: (NOTE) SARS-CoV-2 target nucleic acids are NOT DETECTED. The SARS-CoV-2 RNA is generally detectable in upper and lower respiratory specimens during the acute phase of infection. Negative results do not preclude SARS-CoV-2 infection, do not rule out co-infections with other pathogens,  and should not be used as the sole basis for treatment or other patient management decisions. Negative results must be combined with clinical observations, patient history, and epidemiological information. The expected result is Negative. Fact Sheet for Patients: HairSlick.no Fact Sheet for Healthcare Providers: quierodirigir.com This test is not yet approved or cleared by the [place] FDA and  has been authorized for detection and/or diagnosis of SARS-CoV-2 by FDA under an Emergency Use Authorization (EUA). This EUA will remain  in effect (meaning this test can be used) for the duration of the COVID-19 declaration under Section 56 4(b)(1) of the Act, 21 U.S.C. section 360bbb-3(b)(1), unless the authorization is terminated or revoked sooner. Performed at [facility], [address] [place], [place] [address]   MRSA PCR Screening     Status: None   Collection Time: 09/26/19  9:50 PM   Specimen: Nasal Mucosa; Nasopharyngeal  Result Value Ref Range Status   MRSA by PCR NEGATIVE NEGATIVE Final    Comment:        The GeneXpert MRSA Assay (FDA approved for NASAL specimens only), is one component of a comprehensive MRSA colonization surveillance program. It is not intended to diagnose MRSA infection nor to guide or monitor treatment for MRSA infections. Performed at [facility], [address], [place], [place] [address]      Labs: Basic Metabolic Panel: Recent Labs  Lab 09/25/19 2143 09/26/19 0511 09/27/19 0155  NA 141 139 137  K 3.7 5.0 3.8  CL 106 108 104  CO2 GLUCOSE 103* 101* 94  BUN 17 22* 18  CREATININE 1.34* 0.98 0.78  CALCIUM 9.4 8.9 8.9   Liver Function Tests: Recent Labs  Lab 09/25/19 2143  AST 29  ALT 32  ALKPHOS 85  BILITOT 0.2*  PROT 7.7  ALBUMIN 4.3   No results for input(s): LIPASE, AMYLASE in the last 168 hours. No results for input(s): AMMONIA in the last 168  hours. CBC: Recent Labs  Lab 09/25/19 2143 09/26/19 0511 09/26/19 1815 09/27/19 0155  WBC  7.1 6.1 6.5 6.0  HGB 14.3 13.4 14.4 14.2  HCT 43.9 40.7 43.3 42.9  MCV 95.2 94.7 95.0 94.7  PLT 253 234 263 233   Cardiac Enzymes: No results for input(s): CKTOTAL, CKMB, CKMBINDEX, TROPONINI in the last 168 hours. BNP: BNP (last 3 results) No results for input(s): BNP in the last 8760 hours.  ProBNP (last 3 results) No results for input(s): PROBNP in the last 8760 hours.  CBG: No results for input(s): GLUCAP in the last 168 hours.     Signed:  Briant Cedar, MD [facility] 09/27/2019, 11:36 AM

## 2019-09-27 NOTE — Progress Notes (Signed)
Discharge instructions given. Patient verbalizes understanding and instructions to take Eliquis twice daily. Patient with no complaints at the current time. Spouse at bedside. Patient taken to car via [place].

## 2019-09-27 NOTE — Progress Notes (Signed)
ANTICOAGULATION CONSULT NOTE - Follow Up Consult  Pharmacy Consult for heparin Indication: pulmonary embolus  Allergies  Allergen Reactions  . Bee Venom Hives  . Other     Hayfever and Allergy Relief OTC causes eye redness.    Patient Measurements: Height: 5\' 10"  (177.8 cm) Weight: 190 lb (86.2 kg) IBW/kg (Calculated) : 73 Heparin Dosing Weight:   Vital Signs: Temp: 97.8 F (36.6 C) (12/30 2000) Temp Source: Oral (12/30 2000) BP: 113/82 (12/31 0000) Pulse Rate: 66 (12/31 0000)  Labs: Recent Labs    09/25/19 2143 09/25/19 2143 09/25/19 2344 09/26/19 0029 09/26/19 0511 09/26/19 1626 09/26/19 1815 09/26/19 2311 09/27/19 0155  HGB 14.3  --   --   --  13.4  --  14.4  --  14.2  HCT 43.9  --   --   --  40.7  --  43.3  --  42.9  PLT 253  --   --   --  234  --  263  --  233  APTT  --   --   --  28  --   --   --   --   --   LABPROT  --   --   --  13.4  --   --   --   --   --   INR  --   --   --  1.0  --   --   --   --   --   HEPARINUNFRC  --    < >  --   --   --  <0.10*  --  0.46 0.59  CREATININE 1.34*  --   --   --  0.98  --   --   --  0.78  TROPONINIHS 13  --  13  --   --   --   --   --   --    < > = values in this interval not displayed.    Estimated Creatinine Clearance: 114.1 mL/min (by C-G formula based on SCr of 0.78 mg/dL).   Medications:  Infusions:  . heparin 1,500 Units/hr (09/26/19 1800)    Assessment: Patient with heparin level at goal for second time.  No heparin issues noted.  Goal of Therapy:  Heparin level 0.3-0.7 units/ml Monitor platelets by anticoagulation protocol: Yes   Plan:  Continue heparin drip at current rate Recheck level with AM labs  Tyler Deis, Shea Stakes [place] 09/27/2019,4:23 AM

## 2019-12-17 ENCOUNTER — Telehealth: Payer: Self-pay | Admitting: Hematology

## 2019-12-17 NOTE — Telephone Encounter (Signed)
Scheduled appt per 3/22 sch message - unable to reach pt . Left message with appt date and time   

## 2019-12-25 ENCOUNTER — Inpatient Hospital Stay: Payer: Managed Care, Other (non HMO) | Admitting: Hematology

## 2020-01-08 ENCOUNTER — Inpatient Hospital Stay: Payer: Managed Care, Other (non HMO) | Admitting: Hematology

## 2020-01-09 ENCOUNTER — Telehealth: Payer: Self-pay | Admitting: *Deleted

## 2020-01-09 NOTE — Progress Notes (Signed)
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 male who has been referred to [place] by Wilfrid Lund, PA for evaluation and management of Heterozygous prothrombin mutation. The patient's last visit with [place] was on 09/06/2019. The pt reports that he is doing well overall.  The pt reports he is good. He had another blood clot in December. He had nothing new or changes within the months prior and leading up to it. He had a mask on while helping a friends business and he couldn't catch his breath. Pt had a blood clot in the right leg. In early December he was moving houses and the right leg swelled up and was warm to touch. He is tolerating ELIQUIS now and SOB is getting better. Pt does not smoke. He is up to date with age appropriate cancer screenings.   Of note since the patient's last visit, pt has had CTA Chest for PE ([id number]) completed on 09/25/19 with results revealing 1. Positive for acute PE with CT evidence of mild right heart strain (RV/LV Ratio = 1.0) consistent with at least submassive (intermediate risk) PE. The presence of right heart strain has been associated with an increased risk of morbidity and mortality. Please activate Code PE by paging [phone]. 2. No pulmonary infarct or other acute intrathoracic abnormality.  On review of systems, pt denies SOB, sudden weight loss, pain along spine, abdominal pain and any other symptoms.    MEDICAL HISTORY:  Past Medical History:  Diagnosis Date  . Complication of anesthesia   . Constipation   . GERD (gastroesophageal reflux disease)    takes tums  . H/O hiatal hernia     . Headache(784.0)    not recently  . Hemorrhoid   . Neck pain   . PONV (postoperative nausea and vomiting)      SURGICAL HISTORY: Past Surgical History:  Procedure Laterality Date  . KNEE ARTHROSCOPY W/ ACL RECONSTRUCTION  2002   left  . QUADRICEPS TENDON REPAIR  10/26/2012   Procedure: REPAIR QUADRICEP TENDON;  Surgeon: Senaida Lange, MD;  Location: [facility] OR;  Service: Orthopedics;  Laterality: Left;  Marland Kitchen VASECTOMY       SOCIAL HISTORY: Social History   Socioeconomic History  . Marital status: Married    Spouse name: Not on file  . Number of children: Not on file  . Years of education: Not on file  . Highest education level: Not on file  Occupational History  . Occupation: Accountant/Auditor  Tobacco Use  . Smoking status: Never Smoker  . Smokeless tobacco: Never Used  Substance and Sexual Activity  . Alcohol use: Yes    Comment: moderate use  . Drug use: No  . Sexual activity: Not on file  Other Topics Concern  . Not on file  Social History Narrative   Left-handed.   Social Determinants of Health   Financial Resource Strain:   . Difficulty of Paying Living Expenses:   Food Insecurity:   . Worried About Programme researcher, broadcasting/film/video in the Last Year:   . Barista in the Last Year:   Transportation Needs:   . Freight forwarder (Medical):   Marland Kitchen  Lack of Transportation (Non-Medical):   Physical Activity:   . Days of Exercise per Week:   . Minutes of Exercise per Session:   Stress:   . Feeling of Stress :   Social Connections:   . Frequency of Communication with Friends and Family:   . Frequency of Social Gatherings with Friends and Family:   . Attends Religious Services:   . Active Member of Clubs or Organizations:   . Attends Banker Meetings:   Marland Kitchen Marital Status:   Intimate Partner Violence:   . Fear of Current or Ex-Partner:   . Emotionally Abused:   Marland Kitchen Physically Abused:   . Sexually Abused:      FAMILY HISTORY: Family History   Problem Relation Age of Onset  . Heart disease Father   . Hypertension Father   . Deep vein thrombosis Mother   . Deep vein thrombosis Brother      ALLERGIES:   is allergic to bee venom and other.   MEDICATIONS:  Current Outpatient Medications  Medication Sig Dispense Refill  . Apixaban Starter Pack (ELIQUIS STARTER PACK) 5 MG TBPK Take as directed on package: start with two-5mg  tablets twice daily for 7 days. On day 8, switch to one-5mg  tablet twice daily. 1 each 0  . EPINEPHrine 0.3 MG/0.3ML SOSY Inject 1 each into the skin as needed (allergic reaction).     Marland Kitchen scopolamine (TRANSDERM-SCOP) 1 MG/3DAYS Place 1 patch onto the skin daily as needed (sea sickness).     . famotidine (PEPCID) 20 MG tablet Take 20 mg by mouth 2 (two) times daily.     Marland Kitchen loratadine (CLARITIN) 10 MG tablet Take 10 mg by mouth daily.     No current facility-administered medications for this visit.     REVIEW OF SYSTEMS:   A 10+ POINT REVIEW OF SYSTEMS WAS OBTAINED including neurology, dermatology, psychiatry, cardiac, respiratory, lymph, extremities, GI, GU, Musculoskeletal, constitutional, breasts, reproductive, HEENT.  All pertinent positives are noted in the HPI.  All others are negative.   PHYSICAL EXAMINATION: ECOG PERFORMANCE STATUS: 0 - Asymptomatic  Vitals:   01/10/20 0922  BP: 134/88  Pulse: 65  Resp: 18  Temp: 98.4 F (36.9 C)  SpO2: 99%   Filed Weights   01/10/20 0922  Weight: 196 lb (88.9 kg)   Body mass index is 28.12 kg/m.   GENERAL:alert, in no acute distress and comfortable SKIN: no acute rashes, no significant lesions EYES: conjunctiva are pink and non-injected, sclera anicteric OROPHARYNX: MMM, no exudates, no oropharyngeal erythema or ulceration NECK: supple, no JVD LYMPH:  no palpable lymphadenopathy in the cervical, axillary or inguinal regions LUNGS: clear to auscultation b/l with normal respiratory effort HEART: regular rate & rhythm ABDOMEN:  normoactive bowel  sounds , non tender, not distended. Extremity: no pedal edema PSYCH: alert & oriented x 3 with fluent speech NEURO: no focal motor/sensory deficits    LABORATORY DATA:  I have reviewed the data as listed  CBC Latest Ref Rng & Units 09/27/2019 09/26/2019 09/26/2019  WBC 4.0 - 10.5 K/uL 6.0 6.5 6.1  Hemoglobin 13.0 - 17.0 g/dL 74.9 44.9 67.5  Hematocrit 39.0 - 52.0 % 42.9 43.3 40.7  Platelets 150 - 400 K/uL 233 263 234    CMP Latest Ref Rng & Units 09/27/2019 09/26/2019 09/25/2019  Glucose 70 - 99 mg/dL 94 916(B) 846(K)  BUN 6 - 20 mg/dL 18 59(D) 17  Creatinine 0.61 - 1.24 mg/dL 3.57 0.17 7.93(J)  Sodium 135 - 145 mmol/L 137  139 141  Potassium 3.5 - 5.1 mmol/L 3.8 5.0 3.7  Chloride 98 - 111 mmol/L 104 108 106  CO2 22 - 32 mmol/L 22 24 24   Calcium 8.9 - 10.3 mg/dL 8.9 8.9 9.4  Total Protein 6.5 - 8.1 g/dL - - 7.7  Total Bilirubin 0.3 - 1.2 mg/dL - - 0.2(L)  Alkaline Phos 38 - 126 U/L - - 85  AST 15 - 41 U/L - - 29  ALT 0 - 44 U/L - - 32    07/17/2019:  Factor II, DNA Analysis Single G-20210-A mutation Identified (heterozygote) "These factors include the R506Q (leiden) mutation in the Factor V gene, plasma homocysteine levels, as well as testing for deficiencies of antithrombin III, Protein C and protein s."  RADIOGRAPHIC STUDIES: I have personally reviewed the radiological images as listed and agreed with the findings in the report. No results found.   ASSESSMENT & PLAN:  Shane Graham is a 49 y.o. male with:   1. Heterozygous prothrombin gene mutation.  Single Prothrombin G-20210-A mutation Identified (heterozygote) FHX of clots on mother's side of family  2. Unprovoked PE , acute unprovoked RLE DVT and age indeterminate LLE DVT Likely driven by prothrombin gene mutation. PLAN: -Discussed 09/24/20 CTA Chest for PE and [place] lower extremity venous. 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 -Discussed travel precautions and other preventive methods for blood clots. -Recommended that the pt continue to eat well, drink at least 48-64 oz of water each day, and walk 20-30 minutes each day.  -Recommends keeping up with age appropriate cancer screenings  -Recommends sports compression socks- 20-30 mmHg  -Recommends life-long blood thinners- ELIQUIS 5mg  po twice a day  -Recommends related family members being tested for prothrombin gene mutation -f/u with PCP about Eliquis refill and monitoring of life long anticoagulation. -Will see back as needed    FOLLOW UP: Will see back as needed   The total time spent in the appt was 30 minutes and more than 50% was on counseling and direct patient cares.  All of the patient's questions were answered with apparent satisfaction. The patient knows to call the clinic with any problems, questions or concerns.   Sullivan Lone MD [place] AAHIVMS [facility] Hematology/Oncology Physician [facility]  (Office):       [phone] (Work cell):  [phone] (Fax):           [phone]  01/10/2020 9:55 AM  I, Dawayne Cirri am acting as a scribe for Dr. Sullivan Lone.   .I have reviewed the above documentation for accuracy and completeness, and I agree with the above. Brunetta Genera MD

## 2020-01-10 ENCOUNTER — Inpatient Hospital Stay: Payer: Managed Care, Other (non HMO) | Attending: Hematology | Admitting: Hematology

## 2020-01-10 ENCOUNTER — Other Ambulatory Visit: Payer: Self-pay

## 2020-01-10 VITALS — BP 134/88 | HR 65 | Temp 98.4°F | Resp 18 | Ht 70.0 in | Wt 196.0 lb

## 2020-01-10 DIAGNOSIS — Z596 Low income: Secondary | ICD-10-CM | POA: Diagnosis not present

## 2020-01-10 DIAGNOSIS — I82413 Acute embolism and thrombosis of femoral vein, bilateral: Secondary | ICD-10-CM

## 2020-01-10 DIAGNOSIS — Z8249 Family history of ischemic heart disease and other diseases of the circulatory system: Secondary | ICD-10-CM | POA: Diagnosis not present

## 2020-01-10 DIAGNOSIS — Z8719 Personal history of other diseases of the digestive system: Secondary | ICD-10-CM | POA: Insufficient documentation

## 2020-01-10 DIAGNOSIS — I2699 Other pulmonary embolism without acute cor pulmonale: Secondary | ICD-10-CM

## 2020-01-10 DIAGNOSIS — I82403 Acute embolism and thrombosis of unspecified deep veins of lower extremity, bilateral: Secondary | ICD-10-CM | POA: Insufficient documentation

## 2020-01-10 DIAGNOSIS — D6852 Prothrombin gene mutation: Secondary | ICD-10-CM | POA: Diagnosis not present

## 2020-01-10 DIAGNOSIS — M7989 Other specified soft tissue disorders: Secondary | ICD-10-CM | POA: Insufficient documentation

## 2020-01-10 DIAGNOSIS — R0602 Shortness of breath: Secondary | ICD-10-CM | POA: Insufficient documentation

## 2020-01-10 DIAGNOSIS — Z7901 Long term (current) use of anticoagulants: Secondary | ICD-10-CM | POA: Diagnosis not present

## 2020-01-11 NOTE — Telephone Encounter (Signed)
Opened in error

## 2020-03-13 IMAGING — CT CT ANGIO CHEST
2 of 6 series · 18 of 36 positions shown · IV contrast (OMNIPAQUE 350)
Comparison: Chest CTA 11/02/2005. No interval exams.

CLINICAL DATA: Shortness of breath. D-dimer. Patient reports
shortness of breath with exercise for 2 weeks. Patient reports
prothrombin bleeding disorder in family history of blood clots.

EXAM:
CT ANGIOGRAPHY CHEST WITH CONTRAST
TECHNIQUE: Multidetector CT imaging of the chest was performed using the
standard protocol during bolus administration of intravenous
contrast. Multiplanar CT image reconstructions and MIPs were
obtained to evaluate the vascular anatomy.
CONTRAST:  100mL OMNIPAQUE IOHEXOL 350 MG/ML SOLN

[Series 5: thins · axial · 0.65mm/px · z∈[+1387,+1652]mm · 17 of 299 slices shown]
[im 17/299  lung]
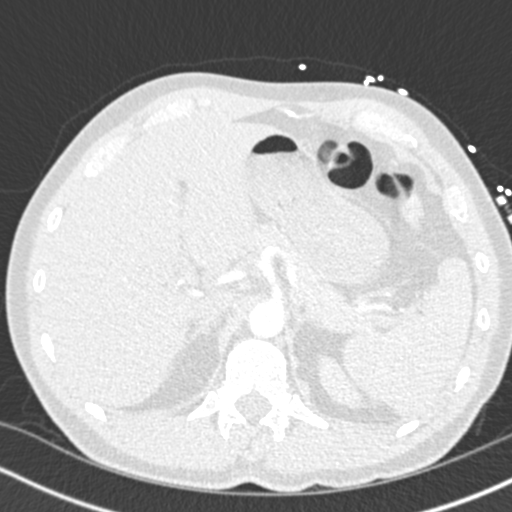
[im 34/299  mediastinal]
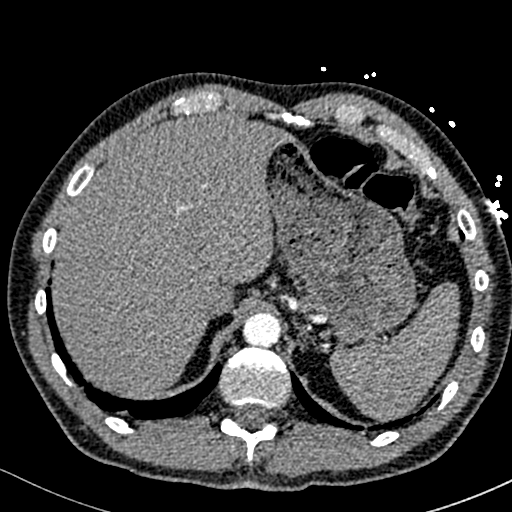
[im 50/299  lung]
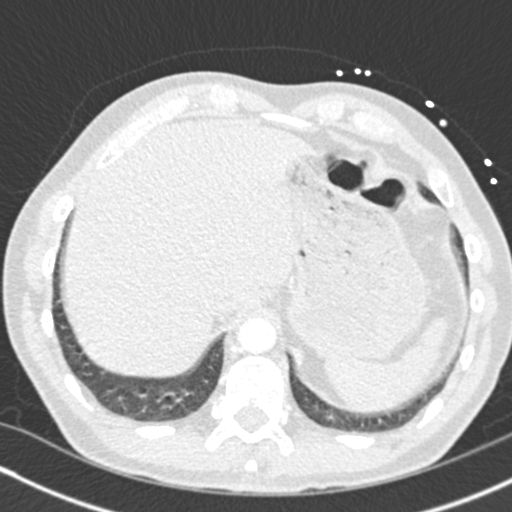
[im 67/299  mediastinal]
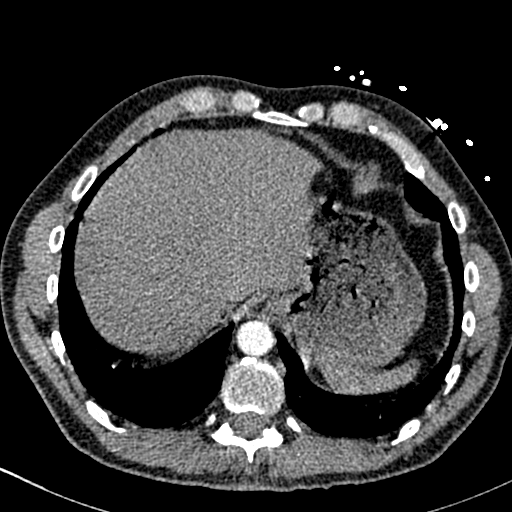
[im 83/299  lung]
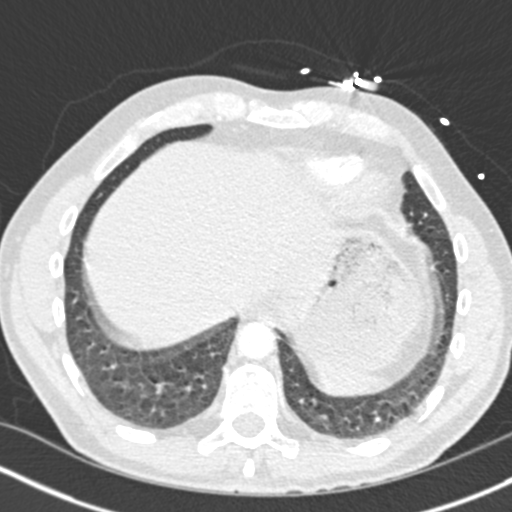
[im 100/299  mediastinal]
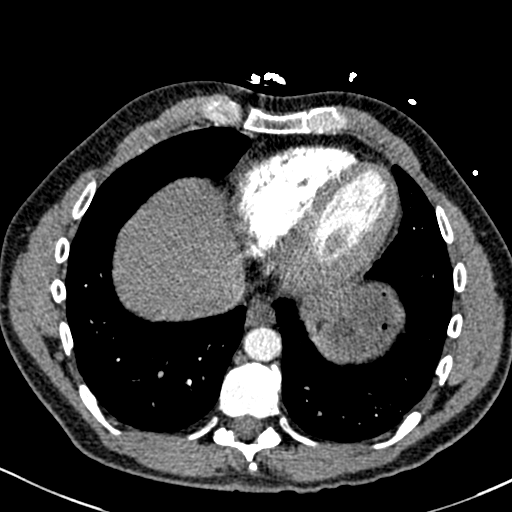
[im 116/299  lung]
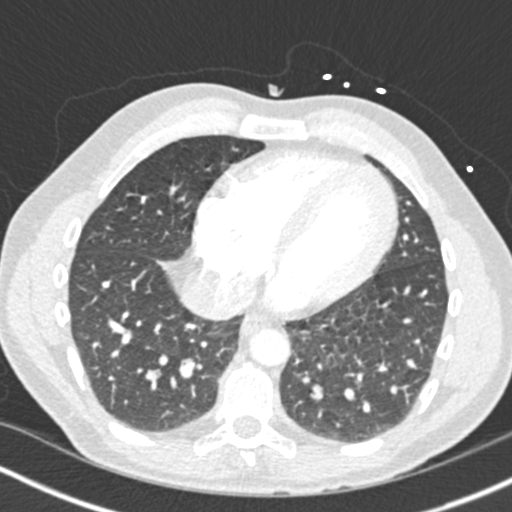
[im 133/299  mediastinal]
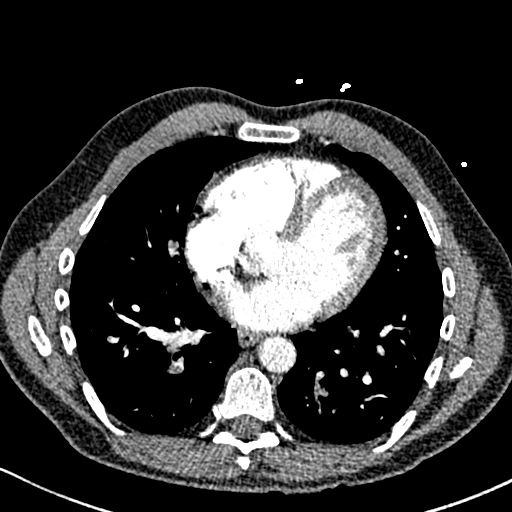
[im 150/299  lung]
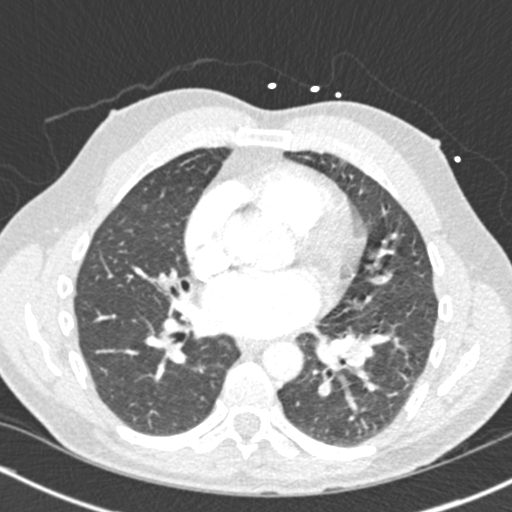
[im 166/299  mediastinal]
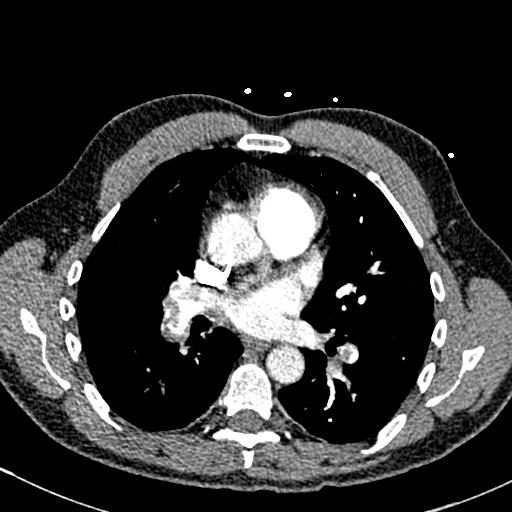
[im 183/299  lung]
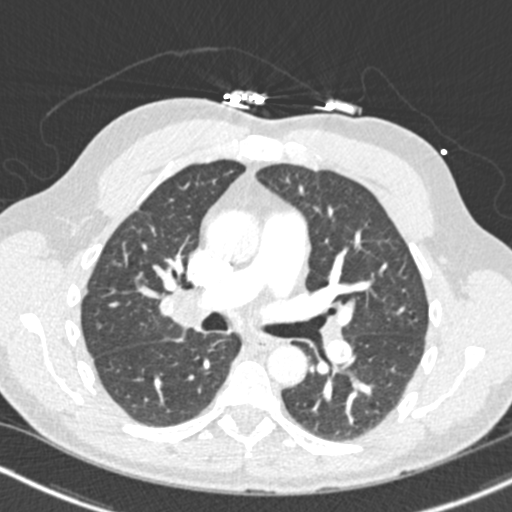
[im 199/299  mediastinal]
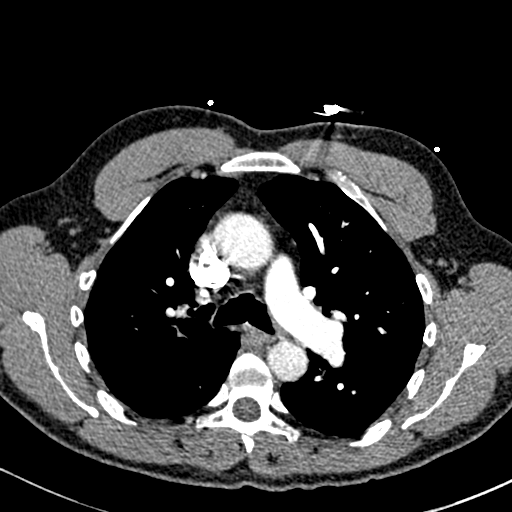
[im 216/299  lung]
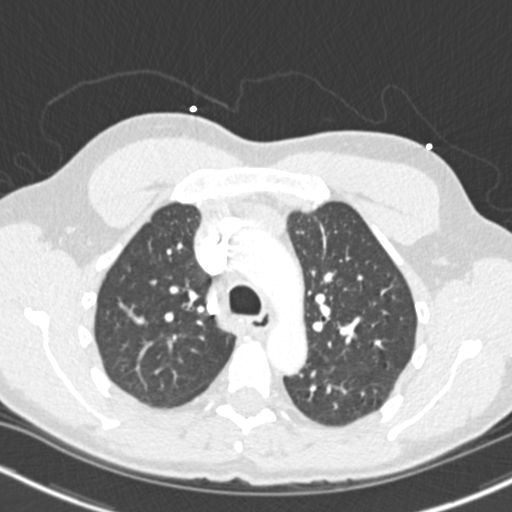
[im 232/299  mediastinal]
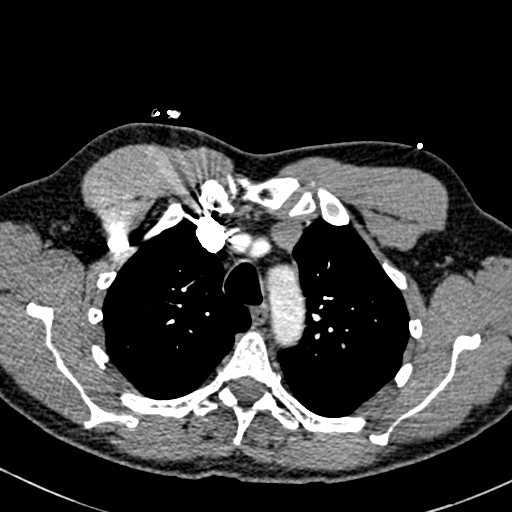
[im 249/299  lung]
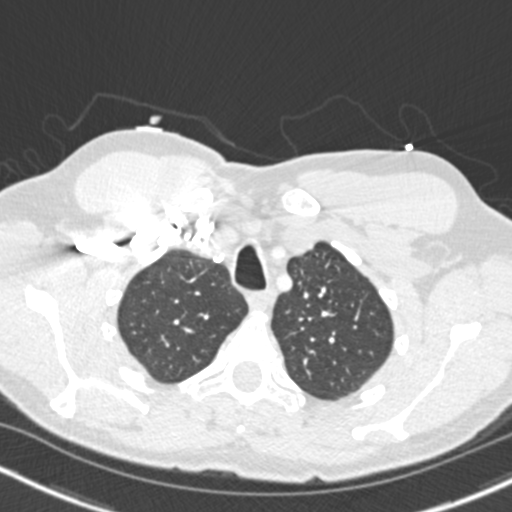
[im 265/299  mediastinal]
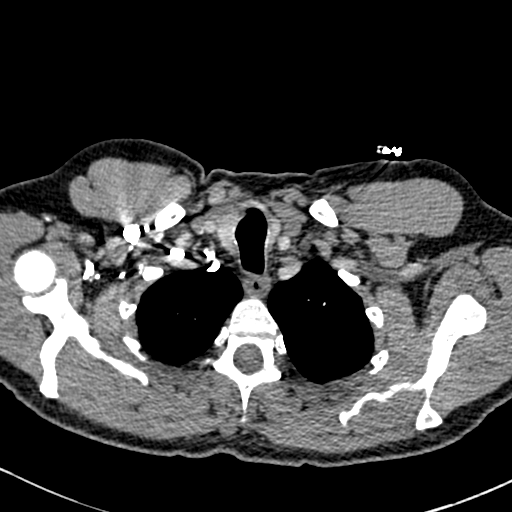
[im 282/299  lung]
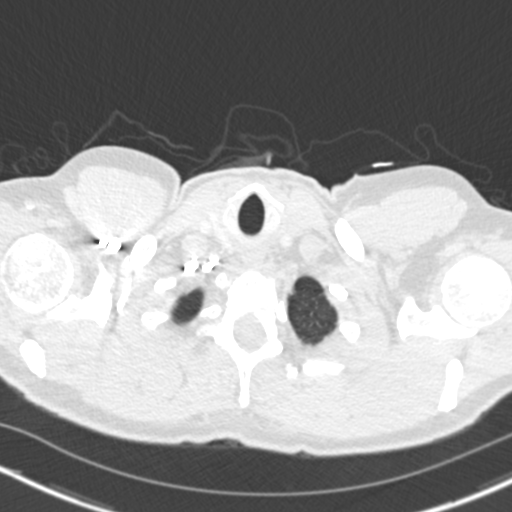

[Series 7: coronal mpr · coronal · 0.60mm/px · 1 of 128 slices shown]
[im 64/128  mediastinal]
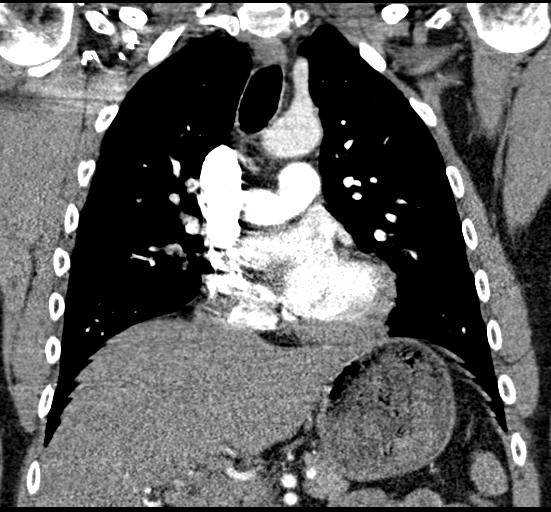

[18 of 36 positions shown; findings below may reference images not displayed]

FINDINGS: Cardiovascular: Positive for acute pulmonary emboli. Filling defect
in the distal main pulmonary artery extending into lobar and
segmental branches. Filling defects in the left lower lobar
pulmonary artery extending into segmental and subsegmental branches.
Thromboembolic burden is moderate. RV to LV ratio is 1.0. No
pericardial effusion. No aortic dissection. Conventional branching
pattern from the aortic arch.

Mediastinum/Nodes: No enlarged mediastinal or hilar lymph nodes.
Tiny hiatal hernia, esophagus is decompressed. No visualized thyroid
nodule.

Lungs/Pleura: No evidence of pulmonary infarct or focal airspace
disease. No pleural fluid. No pulmonary edema. Trachea and mainstem
bronchi are patent. Motion artifact at the lung bases partially
obscures evaluation.

Upper Abdomen: No acute findings.

Musculoskeletal: There are no acute or suspicious osseous
abnormalities.

Review of the MIP images confirms the above findings.
IMPRESSION: 1. Positive for acute PE with CT evidence of mild right heart strain
(RV/LV Ratio = 1.0) consistent with at least submassive
(intermediate risk) PE. The presence of right heart strain has been
associated with an increased risk of morbidity and mortality. Please
activate Code PE by paging [phone].
2. No pulmonary infarct or other acute intrathoracic abnormality.

Critical Value/emergent results were called by telephone at the time
of interpretation on 09/26/2019 at [DATE] to Dr AFIFA TOOHEY , who
verbally acknowledged these results.

## 2020-07-28 ENCOUNTER — Other Ambulatory Visit: Payer: Self-pay

## 2020-07-28 DIAGNOSIS — M7989 Other specified soft tissue disorders: Secondary | ICD-10-CM

## 2020-07-29 ENCOUNTER — Other Ambulatory Visit: Payer: Self-pay

## 2020-07-29 ENCOUNTER — Ambulatory Visit (HOSPITAL_COMMUNITY)
Admission: RE | Admit: 2020-07-29 | Discharge: 2020-07-29 | Disposition: A | Discharge status: Home / Self Care | Payer: Managed Care, Other (non HMO) | Source: Ambulatory Visit | Attending: Physician Assistant | Admitting: Physician Assistant

## 2020-07-29 DIAGNOSIS — M7989 Other specified soft tissue disorders: Secondary | ICD-10-CM | POA: Diagnosis present

## 2020-07-30 ENCOUNTER — Ambulatory Visit (INDEPENDENT_AMBULATORY_CARE_PROVIDER_SITE_OTHER): Payer: Managed Care, Other (non HMO) | Admitting: Physician Assistant

## 2020-07-30 VITALS — BP 112/77 | HR 69 | Temp 98.4°F | Resp 20 | Ht 70.0 in | Wt 196.0 lb

## 2020-07-30 DIAGNOSIS — M7989 Other specified soft tissue disorders: Secondary | ICD-10-CM

## 2020-07-30 DIAGNOSIS — I82512 Chronic embolism and thrombosis of left femoral vein: Secondary | ICD-10-CM

## 2020-07-30 NOTE — Progress Notes (Signed)
[facility]           [facility] [place]  History and Physical   Shane Graham is a 49 y.o. male who presents with LLE leg swelling mainly around the ankle.  He has hx of PE and hx of LLE DVT in the past.  He is on Eliquis.  He sees Dr. Candise Che with hematology and it is felt that blood clot likely driven by prothrombin gene mutation.  Pt does have family hx of blood clots on his mother's side. He states that he has had multiple knee/leg surgeries on the left with ACL/MCL repairs in 2004 and quadriceps repair in 2014.  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  He has swelling in left ankle more so with prolonged sitting.  He states that he did have some swelling in the right leg with his DVT, but this has improved.  He does wear knee high compression socks.  He does not have any skin changes present.     The pt is not on a statin for cholesterol management.  The pt is not on a daily aspirin.   Other AC:  Eliquis The pt is not on medication for hypertension.   The pt is not diabetic.   Tobacco hx:  never   Past Medical History:  Diagnosis Date  . Complication of anesthesia   . Constipation   . GERD (gastroesophageal reflux disease)    takes tums  . H/O hiatal hernia   . Headache(784.0)    not recently  . Hemorrhoid   . Neck pain   . PONV (postoperative nausea and vomiting)     Past Surgical History:  Procedure Laterality Date  . KNEE ARTHROSCOPY W/ ACL RECONSTRUCTION  2002   left  . QUADRICEPS TENDON REPAIR  10/26/2012   Procedure: REPAIR QUADRICEP TENDON;  Surgeon: Senaida Lange, MD;  Location: [facility] OR;  Service: Orthopedics;  Laterality: Left;  Marland Kitchen VASECTOMY      Social History   Socioeconomic History  . Marital status: Married    Spouse name: Not on file  . Number of children: Not on file  . Years of education: Not on file  . Highest education level: Not on file  Occupational History  . Occupation:  Accountant/Auditor  Tobacco Use  . Smoking status: Never Smoker  . Smokeless tobacco: Never Used  Substance and Sexual Activity  . Alcohol use: Yes    Comment: moderate use  . Drug use: No  . Sexual activity: Not on file  Other Topics Concern  . Not on file  Social History Narrative   Left-handed.   Social Determinants of Health   Financial Resource Strain:   . Difficulty of Paying Living Expenses: Not on file  Food Insecurity:   . Worried About Programme researcher, broadcasting/film/video in the Last Year: Not on file  . Ran Out of Food in the Last Year: Not on file  Transportation Needs:   . Lack of Transportation (Medical): Not on file  . Lack of Transportation (Non-Medical): Not on file  Physical Activity:   . Days of Exercise per Week: Not on file  . Minutes of Exercise per Session: Not on file  Stress:   . Feeling of Stress : Not on file  Social Connections:   . Frequency of Communication with Friends and Family: Not on file  . Frequency of Social Gatherings with Friends  and Family: Not on file  . Attends Religious Services: Not on file  . Active Member of Clubs or Organizations: Not on file  . Attends Banker Meetings: Not on file  . Marital Status: Not on file  Intimate Partner Violence:   . Fear of Current or Ex-Partner: Not on file  . Emotionally Abused: Not on file  . Physically Abused: Not on file  . Sexually Abused: Not on file    Family History  Problem Relation Age of Onset  . Heart disease Father   . Hypertension Father   . Deep vein thrombosis Mother   . Deep vein thrombosis Brother     Current Outpatient Medications  Medication Sig Dispense Refill  . Apixaban Starter Pack (ELIQUIS STARTER PACK) 5 MG TBPK Take as directed on package: start with two-5mg  tablets twice daily for 7 days. On day 8, switch to one-5mg  tablet twice daily. 1 each 0  . EPINEPHrine 0.3 MG/0.3ML SOSY Inject 1 each into the skin as needed (allergic reaction).     . famotidine (PEPCID)  20 MG tablet Take 20 mg by mouth 2 (two) times daily.     Marland Kitchen loratadine (CLARITIN) 10 MG tablet Take 10 mg by mouth daily.    Marland Kitchen scopolamine (TRANSDERM-SCOP) 1 MG/3DAYS Place 1 patch onto the skin daily as needed (sea sickness).      No current facility-administered medications for this visit.    Allergies  Allergen Reactions  . Bee Venom Hives  . Other     Hayfever and Allergy Relief OTC causes eye redness.    REVIEW OF SYSTEMS:   [X]  denotes positive finding, [ ]  denotes negative finding Cardiac  Comments:  Chest pain or chest pressure:    Shortness of breath upon exertion:    Short of breath when lying flat:    Irregular heart rhythm:        Vascular    Pain in calf, thigh, or hip brought on by ambulation:    Pain in feet at night that wakes you up from your sleep:     Blood clot in your veins:    Leg swelling:  x       Pulmonary    Oxygen at home:    Productive cough:     Wheezing:         Neurologic    Sudden weakness in arms or legs:     Sudden numbness in arms or legs:     Sudden onset of difficulty speaking or slurred speech:    Temporary loss of vision in one eye:     Problems with dizziness:         Gastrointestinal    Blood in stool:     Vomited blood:         Genitourinary    Burning when urinating:     Blood in urine:        Psychiatric    Major depression:         Hematologic    Bleeding problems:    Problems with blood clotting too easily:        Skin    Rashes or ulcers:        Constitutional    Fever or chills:      PHYSICAL EXAMINATION:  Today's Vitals   07/30/20 0918  BP: 112/77  Pulse: 69  Resp: 20  Temp: 98.4 F (36.9 C)  TempSrc: Temporal  SpO2: 98%  Weight: 196 lb (88.9 kg)  Height: 5\' 10"  (1.778 m)  PainSc: 1    Body mass index is 28.12 kg/m.   General:  WDWN in NAD; vital signs documented above Gait: Not observed HENT: WNL, normocephalic Pulmonary: normal non-labored breathing without wheezing Cardiac:  regular HR; without carotid bruits Abdomen: soft, NT, no masses; aortic pulse is not palpable Skin: without rashes Vascular Exam/Pulses:  Right Left  Radial 2+ (normal) 2+ (normal)  DP Unable to palpate 2+ (normal)  PT 2+ (normal) Unable to palpate   Extremities: without ischemic changes, without cellulitis; without open wounds; without skin color changes; mild edema BLE.  There are some varicosities present medially distal to knee. Musculoskeletal: no muscle wasting or atrophy  Neurologic: A&O X 3;  moving all extremities equally Psychiatric:  The pt has Normal affect.   Non-Invasive Vascular Imaging:   Venous duplex on 07/29/2020: LEFT     Reflux NoRefluxReflux TimeDiameter cmsComments                   Yes                       +--------------+---------+------+-----------+------------+----------------+  CFV            yes  >1 second       chronic  thrombus  +--------------+---------+------+-----------+------------+----------------+  FV mid                        chronic  thrombus  +--------------+---------+------+-----------+------------+----------------+  FV dist          yes  >1 second       chronic  thrombus  +--------------+---------+------+-----------+------------+----------------+  Popliteal                       chronic  thrombus  +--------------+---------+------+-----------+------------+----------------+  GSV at SFJ                  0.69            +--------------+---------+------+-----------+------------+----------------+  GSV prox thigh                0.98            +--------------+---------+------+-----------+------------+----------------+  GSV mid thigh       yes  >500 ms   0.87              +--------------+---------+------+-----------+------------+----------------+  GSV dist thigh                0.84            +--------------+---------+------+-----------+------------+----------------+  GSV at knee                  0.99            +--------------+---------+------+-----------+------------+----------------+  GSV prox calf                 0.58            +--------------+---------+------+-----------+------------+----------------+  GSV mid calf                 0.55            +--------------+---------+------+-----------+------------+----------------+  SSV Pop Fossa                 0.98            +--------------+---------+------+-----------+------------+----------------+  SSV prox calf                 0.80            +--------------+---------+------+-----------+------------+----------------+  SSV mid calf       yes  >500 ms   0.49            +--------------+---------+------+-----------+------------+----------------+                         0.27            -------------+---------+------+-----------+------------+----------------+    Summary:  Left:  - Color duplex evaluation of the left lower extremity shows there is 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  - Venous reflux is noted in the left greater saphenous vein in the mid thigh.  -Venous reflux is noted in the small saphenous vein mid calf   Shane Graham is a 49 y.o. male who presents with: hx of recurrent DVT/PE  -Pt will need to be on lifelong AC -LLE venous duplex revealed chronic thrombus in the CFV, FV and popliteal vein.  He does have venous reflux in the mid GSV and SSV, however, given his hx of DVT, he is not a candidate for venous  ablation.   -discussed with pt about wearing thigh high 20-24mmHg compression stockings and elevating legs.   He was given a handout and shown proper way to elevate legs.   -also discussed when he is on long car rides, to stop regularly to walk and wear his compression as well.  -pt will f/u as needed  Doreatha Massed, [facility] 07/30/2020 8:58 AM  Clinic MD:  Edilia Bo

## 2022-07-16 ENCOUNTER — Emergency Department (HOSPITAL_COMMUNITY)
Admission: EM | Admit: 2022-07-16 | Discharge: 2022-07-17 | Disposition: A | Discharge status: Against Medical Advice | Payer: Managed Care, Other (non HMO) | Attending: Emergency Medicine | Admitting: Emergency Medicine

## 2022-07-16 ENCOUNTER — Emergency Department (HOSPITAL_COMMUNITY): Payer: Managed Care, Other (non HMO)

## 2022-07-16 ENCOUNTER — Other Ambulatory Visit: Payer: Self-pay

## 2022-07-16 ENCOUNTER — Encounter (HOSPITAL_COMMUNITY): Payer: Self-pay

## 2022-07-16 DIAGNOSIS — Z5329 Procedure and treatment not carried out because of patient's decision for other reasons: Secondary | ICD-10-CM | POA: Diagnosis not present

## 2022-07-16 DIAGNOSIS — K625 Hemorrhage of anus and rectum: Secondary | ICD-10-CM

## 2022-07-16 DIAGNOSIS — Z7901 Long term (current) use of anticoagulants: Secondary | ICD-10-CM | POA: Insufficient documentation

## 2022-07-16 DIAGNOSIS — R109 Unspecified abdominal pain: Secondary | ICD-10-CM | POA: Diagnosis not present

## 2022-07-16 LAB — CBC
HCT: 44 % (ref 39.0–52.0)
Hemoglobin: 15.3 g/dL (ref 13.0–17.0)
MCH: 33 pg (ref 26.0–34.0)
MCHC: 34.8 g/dL (ref 30.0–36.0)
MCV: 95 fL (ref 80.0–100.0)
Platelets: 182 10*3/uL (ref 150–400)
RBC: 4.63 MIL/uL (ref 4.22–5.81)
RDW: 12.8 % (ref 11.5–15.5)
WBC: 5.8 10*3/uL (ref 4.0–10.5)
nRBC: 0 % (ref 0.0–0.2)

## 2022-07-16 LAB — COMPREHENSIVE METABOLIC PANEL
ALT: 26 U/L (ref 0–44)
AST: 29 U/L (ref 15–41)
Albumin: 4.6 g/dL (ref 3.5–5.0)
Alkaline Phosphatase: 48 U/L (ref 38–126)
Anion gap: 12 (ref 5–15)
BUN: 16 mg/dL (ref 6–20)
CO2: 22 mmol/L (ref 22–32)
Calcium: 9.7 mg/dL (ref 8.9–10.3)
Chloride: 105 mmol/L (ref 98–111)
Creatinine, Ser: 0.91 mg/dL (ref 0.61–1.24)
GFR, Estimated: >60 mL/min (ref >60)
Glucose, Bld: 88 mg/dL (ref 70–99)
Potassium: 3.7 mmol/L (ref 3.5–5.1)
Sodium: 139 mmol/L (ref 135–145)
Total Bilirubin: 1 mg/dL (ref 0.3–1.2)
Total Protein: 7.3 g/dL (ref 6.5–8.1)

## 2022-07-16 LAB — TYPE AND SCREEN
ABO/RH(D): O NEG
Antibody Screen: NEGATIVE

## 2022-07-16 MED ORDER — IOHEXOL 350 MG/ML SOLN
75.0000 mL | Freq: Once | INTRAVENOUS | Status: AC | PRN
  Administered 2022-07-16: 75 mL via INTRAVENOUS

## 2022-07-16 NOTE — ED Triage Notes (Signed)
Pt came in POV d/t bloody stools that started yesterday morning around 6am. Pt had 4 bloody stools since yesterday before coming in. Hx of hemorrhoids.

## 2022-07-16 NOTE — ED Provider Triage Note (Cosign Needed Addendum)
Emergency Medicine Provider Triage Evaluation Note  Shane Graham , a 51 y.o. male  was evaluated in triage.  Pt complains of rectal bleeding.  Patient reports that yesterday he had a large bowel movement with bright red blood.  Patient reports he also had blood on his tissue.  The patient states that he has a history of hemorrhoids however he does not have any pain in his rectum right now that is consistent with his typical hemorrhoid episodes.  The patient reports he had 4 bloody stools yesterday and this morning.  The patient states he has been having a "guys weak" for the last 7 days drinking every day.  The patient denies any ibuprofen use.  Patient states he does have history of ulcers, states he takes famotidine once daily.  Patient denies any shortness of breath, chest pain, lightheadedness, dizziness.  Patient also complaining of abdominal pain that is nonfocal in nature.  Review of Systems  Positive:  Negative:   Physical Exam  BP (!) 129/94 (BP Location: Right Arm)   Pulse 66   Temp 97.6 F (36.4 C) (Oral)   Resp 18   SpO2 99%  Gen:   Awake, no distress   Resp:  Normal effort  MSK:   Moves extremities without difficulty  Other:    Medical Decision Making  Medically screening exam initiated at 3:07 PM.  Appropriate orders placed.  Shane Graham was informed that the remainder of the evaluation will be completed by another provider, this initial triage assessment does not replace that evaluation, and the importance of remaining in the ED until their evaluation is complete.       Azucena Cecil, PA-C 07/16/22 [phone]

## 2022-07-17 NOTE — ED Provider Notes (Signed)
[facility] EMERGENCY DEPARTMENT Provider Note   CSN: [id number] Arrival date & time: 07/16/22  1259     History  Chief Complaint  Patient presents with   Rectal Bleeding    Shane Graham is a 51 y.o. male.   Rectal Bleeding Patient presents with rectal bleeding.  Yesterday had bowel movement with blood.  Reportedly stool may have been a little dark but not necessarily black.  Had blood with it.  No nausea or vomiting.  Has a history of hemorrhoids and is on anticoagulation for hypercoagulability and DVT/PE.  No abdominal pain.  Does not feel lightheaded or dizzy.    Past Medical History:  Diagnosis Date   Complication of anesthesia    Constipation    GERD (gastroesophageal reflux disease)    takes tums   H/O hiatal hernia    Headache(784.0)    not recently   Hemorrhoid    Neck pain    PONV (postoperative nausea and vomiting)     Home Medications Prior to Admission medications   Medication Sig Start Date End Date Taking? Authorizing Provider  ELIQUIS 5 MG TABS tablet Take 5 mg by mouth 2 (two) times daily. 05/29/20   [provider]  EPINEPHrine 0.3 MG/0.3ML SOSY Inject 1 each into the skin as needed (allergic reaction).     [provider]  famotidine (PEPCID) 20 MG tablet Take 20 mg by mouth 2 (two) times daily.     [provider]  loratadine (CLARITIN) 10 MG tablet Take 10 mg by mouth daily.    [provider]  scopolamine (TRANSDERM-SCOP) 1 MG/3DAYS Place 1 patch onto the skin daily as needed (sea sickness).     [provider]      Allergies    Bee venom and Other    Review of Systems   Review of Systems  Gastrointestinal:  Positive for hematochezia.    Physical Exam Updated Vital Signs BP (!) 139/95 (BP Location: Left Arm)   Pulse 72   Temp (!) 97.5 F (36.4 C)   Resp 15   SpO2 100%  Physical Exam Vitals and nursing note reviewed.  HENT:     Head: Atraumatic.  Eyes:     Pupils:  Pupils are equal, round, and reactive to light.  Cardiovascular:     Rate and Rhythm: Normal rate.  Pulmonary:     Breath sounds: No wheezing.  Abdominal:     Tenderness: There is no abdominal tenderness.  Musculoskeletal:        General: No tenderness.  Neurological:     Mental Status: He is alert.     ED Results / Procedures / Treatments   Labs (all labs ordered are listed, but only abnormal results are displayed) Labs Reviewed  CBC  COMPREHENSIVE METABOLIC PANEL  TYPE AND SCREEN    EKG None  Radiology CT ABDOMEN PELVIS W CONTRAST  Result Date: 07/16/2022 CLINICAL DATA:  Abdominal pain, rectal bleeding EXAM: CT ABDOMEN AND PELVIS WITH CONTRAST TECHNIQUE: Multidetector CT imaging of the abdomen and pelvis was performed using the standard protocol following bolus administration of intravenous contrast. RADIATION DOSE REDUCTION: This exam was performed according to the departmental dose-optimization program which includes automated exposure control, adjustment of the mA and/or kV according to patient size and/or use of iterative reconstruction technique. CONTRAST:  58mL OMNIPAQUE IOHEXOL 350 MG/ML SOLN COMPARISON:  None Available. FINDINGS: Lower chest: Included lung bases are clear.  Heart size is normal. Hepatobiliary: No focal liver  lesion is seen. No hepatomegaly. Small caliber gallbladder, which may be partially contracted. No hyperdense gallstone. No biliary dilatation. Pancreas: Unremarkable. No pancreatic ductal dilatation or surrounding inflammatory changes. Spleen: Normal in size without focal abnormality. Adrenals/Urinary Tract: Unremarkable adrenal glands. Kidneys enhance symmetrically. Area of cortical scarring within the left kidney. There is a single punctate nonobstructing stone within each kidney. No hydronephrosis. Urinary bladder within normal limits. Stomach/Bowel: Stomach is within normal limits. Appendix appears normal. No evidence of bowel wall thickening,  distention, or inflammatory changes. 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 Portal vein enhances normally without evidence to suggest a portal vein thrombus. Otherwise, no prominent abdominopelvic varices. Normal caliber abdominal aorta. No abdominopelvic lymphadenopathy. Reproductive: Prostate is unremarkable. Other: No free fluid. No abdominopelvic fluid collection. No pneumoperitoneum. No abdominal wall hernia. Musculoskeletal: No acute or significant osseous findings. IMPRESSION: 1. No acute abdominopelvic findings. 2. Punctate bilateral nonobstructing nephrolithiasis. 3. Serpiginous appearance of the portal vein without evidence to suggest acute portal vein thrombosis. 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 Electronically Signed   By: Duanne Guess D.O.   On: 07/16/2022 18:20    Procedures Procedures    Medications Ordered in ED Medications  iohexol (OMNIPAQUE) 350 MG/ML injection 75 mL (75 mLs Intravenous Contrast Given 07/16/22 1754)    ED Course/ Medical Decision Making/ A&P                           Medical Decision Making  Patient with GI bleeding.  Somewhat red blood.  Reviewed pictures patient took.  Hemoglobin reassuring.  Differential diagnosis includes hemorrhoidal bleeding and likely lower GI bleeding.  Not having melena although stool reportedly was little dark.  Discussed with patient and has deferred rectal exam.  States he has some hemorrhoids.  Has had colonoscopy previously which reportedly was negative a couple years ago.  Reassuring.  CT scan done and reassuring.  Appears stable for discharge home with outpatient follow-up.        Final Clinical Impression(s) / ED Diagnoses Final diagnoses:  Rectal bleeding    Rx / DC Orders ED Discharge Orders     None          [name], MD 07/17/22 1205
